# Patient Record
Sex: Female | Born: 1980 | Hispanic: No | Marital: Married | Smoking: Never smoker
Health system: Southern US, Community
[De-identification: ages and names within clinical notes are randomized; demographics above are authoritative.]

## PROBLEM LIST (undated history)

## (undated) DIAGNOSIS — R519 Headache, unspecified: Secondary | ICD-10-CM

## (undated) DIAGNOSIS — G4730 Sleep apnea, unspecified: Secondary | ICD-10-CM

## (undated) DIAGNOSIS — R51 Headache: Secondary | ICD-10-CM

## (undated) HISTORY — PX: WISDOM TOOTH EXTRACTION: SHX21

---

## 2006-01-15 ENCOUNTER — Other Ambulatory Visit: Admission: RE | Admit: 2006-01-15 | Discharge: 2006-01-15 | Payer: Self-pay | Admitting: Obstetrics and Gynecology

## 2007-05-31 ENCOUNTER — Ambulatory Visit: Payer: Self-pay | Admitting: Family Medicine

## 2010-10-03 ENCOUNTER — Ambulatory Visit: Payer: Self-pay | Admitting: Internal Medicine

## 2016-06-29 ENCOUNTER — Other Ambulatory Visit: Payer: Self-pay | Admitting: Family Medicine

## 2016-06-29 ENCOUNTER — Ambulatory Visit
Admission: RE | Admit: 2016-06-29 | Discharge: 2016-06-29 | Disposition: A | Discharge status: Home / Self Care | Payer: Managed Care, Other (non HMO) | Source: Ambulatory Visit | Attending: Family Medicine | Admitting: Family Medicine

## 2016-06-29 DIAGNOSIS — N83201 Unspecified ovarian cyst, right side: Secondary | ICD-10-CM | POA: Insufficient documentation

## 2016-06-29 DIAGNOSIS — R1032 Left lower quadrant pain: Secondary | ICD-10-CM

## 2017-04-13 ENCOUNTER — Other Ambulatory Visit: Payer: Self-pay | Admitting: Obstetrics and Gynecology

## 2017-04-13 DIAGNOSIS — Z369 Encounter for antenatal screening, unspecified: Secondary | ICD-10-CM

## 2017-05-03 ENCOUNTER — Ambulatory Visit (HOSPITAL_BASED_OUTPATIENT_CLINIC_OR_DEPARTMENT_OTHER)
Admission: RE | Admit: 2017-05-03 | Discharge: 2017-05-03 | Disposition: A | Discharge status: Home / Self Care | Payer: BLUE CROSS/BLUE SHIELD | Source: Ambulatory Visit | Attending: Maternal and Fetal Medicine | Admitting: Maternal and Fetal Medicine

## 2017-05-03 ENCOUNTER — Encounter: Payer: Self-pay | Admitting: *Deleted

## 2017-05-03 ENCOUNTER — Ambulatory Visit
Admission: RE | Admit: 2017-05-03 | Discharge: 2017-05-03 | Disposition: A | Discharge status: Home / Self Care | Payer: Managed Care, Other (non HMO) | Source: Ambulatory Visit | Attending: Maternal and Fetal Medicine | Admitting: Maternal and Fetal Medicine

## 2017-05-03 VITALS — BP 123/69 | HR 88 | Temp 98.3°F | Resp 18 | Ht 63.6 in | Wt 121.2 lb

## 2017-05-03 DIAGNOSIS — O09511 Supervision of elderly primigravida, first trimester: Secondary | ICD-10-CM | POA: Insufficient documentation

## 2017-05-03 DIAGNOSIS — Z3401 Encounter for supervision of normal first pregnancy, first trimester: Secondary | ICD-10-CM | POA: Insufficient documentation

## 2017-05-03 DIAGNOSIS — Z369 Encounter for antenatal screening, unspecified: Secondary | ICD-10-CM

## 2017-05-03 DIAGNOSIS — Z3A12 12 weeks gestation of pregnancy: Secondary | ICD-10-CM | POA: Insufficient documentation

## 2017-05-03 HISTORY — DX: Sleep apnea, unspecified: G47.30

## 2017-05-03 NOTE — Progress Notes (Addendum)
Referring Provider:   [facility] Ob/Gyn Length of Consultation: 45 minutes  Michele Hicks was referred to [facility] for genetic counseling because of advanced maternal age.  The patient will be 36 years old at the time of delivery.  This note summarizes the information we discussed.    We explained that the chance of a chromosome abnormality increases with maternal age.  Chromosomes and examples of chromosome problems were reviewed.  Humans typically have 46 chromosomes in each cell, with half passed through each sperm and egg.  Any change in the number or structure of chromosomes can increase the risk of problems in the physical and mental development of a pregnancy.   Based upon age of the patient, the chance of any chromosome abnormality was 1 in 59. The chance of Down syndrome, the most common chromosome problem associated with maternal age, was 1 in 31.  The risk of chromosome problems is in addition to the 3% general population risk for birth defects and mental retardation.  The greatest chance, of course, is that the baby would be born in good health.  We discussed the following prenatal screening and testing options for this pregnancy:  First trimester screening, which includes nuchal translucency ultrasound screen and first trimester maternal serum marker screening.  The nuchal translucency has approximately an 80% detection rate for Down syndrome and can be positive for other chromosome abnormalities as well as heart defects.  When combined with a maternal serum marker screening, the detection rate is up to 90% for Down syndrome and up to 97% for trisomy 18.     The chorionic villus sampling procedure is available for first trimester chromosome analysis.  This involves the withdrawal of a small amount of chorionic villi (tissue from the developing placenta).  Risk of pregnancy loss is estimated to be approximately 1 in 200 to 1 in 100 (0.5 to 1%).  There is  approximately a 1% (1 in 100) chance that the CVS chromosome results will be unclear.  Chorionic villi cannot be tested for neural tube defects.     Maternal serum marker screening, a blood test that measures pregnancy proteins, can provide risk assessments for Down syndrome, trisomy 18, and open neural tube defects (spina bifida, anencephaly). Because it does not directly examine the fetus, it cannot positively diagnose or rule out these problems.  Targeted ultrasound uses high frequency sound waves to create an image of the developing fetus.  An ultrasound is often recommended as a routine means of evaluating the pregnancy.  It is also used to screen for fetal anatomy problems (for example, a heart defect) that might be suggestive of a chromosomal or other abnormality.   Amniocentesis involves the removal of a small amount of amniotic fluid from the sac surrounding the fetus with the use of a thin needle inserted through the maternal abdomen and uterus.  Ultrasound guidance is used throughout the procedure.  Fetal cells from amniotic fluid are directly evaluated and > 99.5% of chromosome problems and > 98% of open neural tube defects can be detected. This procedure is generally performed after the 15th week of pregnancy.  The main risks to this procedure include complications leading to miscarriage in less than 1 in 200 cases (0.5%).  We also reviewed the availability of cell free fetal DNA testing from maternal blood to determine whether or not the baby may have either Down syndrome, trisomy 59, or trisomy 21.  This test utilizes a maternal blood sample and  DNA sequencing technology to isolate circulating cell free fetal DNA from maternal plasma.  The fetal DNA can then be analyzed for DNA sequences that are derived from the three most common chromosomes involved in aneuploidy, chromosomes 13, 18, and 21.  If the overall amount of DNA is greater than the expected level for any of these chromosomes,  aneuploidy is suspected.  While we do not consider it a replacement for invasive testing and karyotype analysis, a negative result from this testing would be reassuring, though not a guarantee of a normal chromosome complement for the baby.  An abnormal result is certainly suggestive of an abnormal chromosome complement, though we would still recommend CVS or amniocentesis to confirm any findings from this testing.  Cystic Fibrosis and Spinal Muscular Atrophy (SMA) screening were also discussed with the patient. Both conditions are recessive, which means that both parents must be carriers in order to have a child with the disease.  Cystic fibrosis (CF) is one of the most common genetic conditions in persons of Caucasian ancestry.  This condition occurs in approximately 1 in 2,500 Caucasian persons and results in thickened secretions in the lungs, digestive, and reproductive systems.  For a baby to be at risk for having CF, both of the parents must be carriers for this condition.  Approximately 1 in 38 Caucasian persons is a carrier for CF.  Current carrier testing looks for the most common mutations in the gene for CF and can detect approximately 90% of carriers in the Caucasian population.  This means that the carrier screening can greatly reduce, but cannot eliminate, the chance for an individual to have a child with CF.  If an individual is found to be a carrier for CF, then carrier testing would be available for the partner. As part of [place]'s newborn screening profile, all babies born in the state of [place] will have a two-tier screening process.  Specimens are first tested to determine the concentration of immunoreactive trypsinogen (IRT).  The top 5% of specimens with the highest IRT values then undergo DNA testing using a panel of over 40 common CF mutations. SMA is a neurodegenerative disorder that leads to atrophy of skeletal muscle and overall weakness.  This condition is also more  prevalent in the Caucasian population, with 1 in 40-1 in 60 persons being a carrier and 1 in 6,000-1 in 10,000 children being affected.  There are multiple forms of the disease, with some causing death in infancy to other forms with survival into adulthood.  The genetics of SMA is complex, but carrier screening can detect up to 95% of carriers in the Caucasian population.  Similar to CF, a negative result can greatly reduce, but cannot eliminate, the chance to have a child with SMA.  We obtained a detailed family history and pregnancy history.  The family history is unremarkable for birth defects, developmental delays, recurrent pregnancy loss or known chromosome abnormalities.  Ms. Mahl stated that this is her first pregnancy.  She reported no complications or exposures that would be expected to increase the risk for birth defects.  After consideration of the options, Ms. Mikowski elected to proceed with cell free DNA testing and to decline CF and SMA carrier screening. The patient indicated that she would likely not be interested in invasive prenatal diagnosis regardless of the results of her screening, but would like to know if there is an increased risk.  An ultrasound was performed at the time of the visit.  The  gestational age was consistent with  12 weeks.  Fetal anatomy could not be assessed due to early gestational age.  Please refer to the ultrasound report for details of that study.  Ms. Elesa MassedWard was encouraged to call with questions or concerns.  We can be contacted at [phone].    Michele Andersoneborah F. Wells, MS, CGC  I was immediately available and supervising. Michele PhenesBrenna L Tanayia Wahlquist, MD [facility]

## 2017-05-08 LAB — INFORMASEQ(SM) WITH XY ANALYSIS
FETAL NUMBER: 1
Fetal Fraction (%):: 11.8
Gestational Age at Collection: 12 weeks
Weight: 121 [lb_av]

## 2017-05-10 ENCOUNTER — Telehealth: Payer: Self-pay | Admitting: Obstetrics and Gynecology

## 2017-05-10 NOTE — Telephone Encounter (Signed)
The patient was informed of the results of her recent InformaSeq testing (performed at [facility]) which yielded NEGATIVE results.  The patient's specimen showed DNA consistent with two copies of chromosomes 21, 18 and 13.  The sensitivity for trisomy 21, trisomy 18 and trisomy 13 using this testing are reported as 99.1%, 98.3% and 98.1% respectively.  Thus, while the results of this testing are highly accurate, they are not considered diagnostic at this time.  Should more definitive information be desired, the patient may still consider amniocentesis.   As requested to know by the patient, sex chromosome analysis was included for this sample.  Results was consistent with a female (XX) fetus. This is predicted with >97% accuracy.  A maternal serum AFP only should be considered if screening for neural tube defects is desired.  Orren Pietsch F. Armend Hochstatter, MS, CGC   

## 2017-10-08 ENCOUNTER — Encounter (HOSPITAL_COMMUNITY): Payer: Self-pay

## 2017-10-30 NOTE — H&P (Signed)
OB ADMISSION/ HISTORY & PHYSICAL:  Admission Date: No admission date for patient encounter.  Admit Diagnosis: Planned cesarean section  Michele Hicks is a 36 y.o. female presenting for planned primary C/S for breech presentation.  Prenatal History: G1P0   EDC : Not found.  Prenatal care at [facility] Prenatal course complicated by   Breech presentation at 36wks  Abnormal 1 hr GTT-210, 3 hr GTT-08/30/17-WNL (21,308,657,84(81,144,128,85)  AMA: cffDNA negative.   OSA - has mouth guard (s/p ENT eval)  Recommend weekly  NST  for fetal hypoxia risk.  Consider q4wk ultrasound starting @ 28wks.ordered 10/03/17    36 y.o. G1P0000 at 3355w0d by  LMP c/w  week ultrasound Sex of baby and name:  "Darlin Dropeagan Carter " baby girl  Factors complicating this pregnancy    AMA, OSA   Screening results and needs:  NOB:   MBT  AB+  Ab screen  Negative G/C Neg/Neg HIV Negative Hep B/RPR Neg/Non-reactive  Rubella Immune   VZV Immune  Aneuploidy:   First trimester: (Did they do cffDNA or NT/blood draw?) 04/12/17 Informaseq testing = Negative results  Informaseq: 1:10,000 - low risk    Second trimester (AFP/tetra): declines  28 weeks:   Blood consent: 08/23/17  Hgb: 11.1 Platelet Count: 250  Glucola:210, 3 hr GTT-08/30/17- WNL (69,629,528,41(81,144,128,85) Rhogam: N/A  36 weeks:   GBS: negative   G/C: neg/neg  Hgb: 12.1    RPR: Non reactive  Last US:  04/12/17: Vt. Retroverted, Single viable IUP, S=6632w4d, FHR=172bpm, YS and amnion imaged, Cx long and closed                    06/22/17: Normal anatomy scan      Immunization:    Flu in season - declined 09/05/17  Tdap at 27-36 weeks - pt cannot get - had seizures with it in past (given Td in 2017)   Breast, condoms  Medical / Surgical History :  Past medical history:  Past Medical History:  Diagnosis Date  . Sleep apnea      Past surgical history:  Past Surgical History:  Procedure Laterality Date  . WISDOM TOOTH EXTRACTION      Family History: No family  history on file.   Social History:  reports that  has never smoked. she has never used smokeless tobacco. She reports that she does not drink alcohol or use drugs.   Allergies: Codeine and Penicillins    Current Medications at time of admission:  Prior to Admission medications   Medication Sig Start Date End Date Taking? Authorizing Provider  fluticasone (FLONASE) 50 MCG/ACT nasal spray Place into both nostrils daily.    [provider]  Prenatal Vit-Fe Fumarate-FA (MULTIVITAMIN-PRENATAL) 27-0.8 MG TABS tablet Take 1 tablet by mouth daily at 12 noon.    [provider]     Review of Systems: Active FM   Physical Exam:  VS: unknown if currently breastfeeding.    Assessment: [redacted] weeks gestation 0 stage of labor FHR category 1   Plan:   H&P started for planned cesearen section. However, patient went into labor prior to surgery, and was delivered by another provider.  The risks of cesarean section discussed with the patient included but were not limited to: bleeding which may require transfusion or reoperation; infection which may require antibiotics; injury to bowel, bladder, ureters or other surrounding organs; injury to the fetus; need for additional procedures including hysterectomy in the event of a life-threatening hemorrhage; placental abnormalities wth subsequent  pregnancies, incisional problems, thromboembolic phenomenon and other postoperative/anesthesia complications. The patient concurred with the proposed plan, giving informed written consent for the procedure.   Patient has been NPO since yesterday and she will remain NPO for procedure. Anesthesia and OR aware. Preoperative prophylactic antibiotics and SCDs ordered on call to the OR.  To OR when ready.

## 2017-11-09 ENCOUNTER — Encounter
Admission: RE | Admit: 2017-11-09 | Discharge: 2017-11-09 | Disposition: A | Discharge status: Home / Self Care | Payer: BLUE CROSS/BLUE SHIELD | Source: Ambulatory Visit | Attending: Obstetrics and Gynecology | Admitting: Obstetrics and Gynecology

## 2017-11-09 ENCOUNTER — Other Ambulatory Visit: Payer: Self-pay

## 2017-11-09 HISTORY — DX: Headache, unspecified: R51.9

## 2017-11-09 HISTORY — DX: Headache: R51

## 2017-11-09 LAB — BASIC METABOLIC PANEL
Anion gap: 9 (ref 5–15)
BUN: 9 mg/dL (ref 6–20)
CHLORIDE: 102 mmol/L (ref 101–111)
CO2: 23 mmol/L (ref 22–32)
CREATININE: 0.37 mg/dL — AB (ref 0.44–1.00)
Calcium: 8.4 mg/dL — ABNORMAL LOW (ref 8.9–10.3)
GFR calc non Af Amer: >60 mL/min (ref >60)
GLUCOSE: 69 mg/dL (ref 65–99)
Potassium: 4 mmol/L (ref 3.5–5.1)
Sodium: 134 mmol/L — ABNORMAL LOW (ref 135–145)

## 2017-11-09 LAB — CBC
HCT: 35.3 % (ref 35.0–47.0)
Hemoglobin: 12.5 g/dL (ref 12.0–16.0)
MCH: 33 pg (ref 26.0–34.0)
MCHC: 35.3 g/dL (ref 32.0–36.0)
MCV: 93.4 fL (ref 80.0–100.0)
PLATELETS: 261 10*3/uL (ref 150–440)
RBC: 3.77 MIL/uL — AB (ref 3.80–5.20)
RDW: 12.8 % (ref 11.5–14.5)
WBC: 8.4 10*3/uL (ref 3.6–11.0)

## 2017-11-09 LAB — TYPE AND SCREEN
ABO/RH(D): AB POS
Antibody Screen: NEGATIVE
EXTEND SAMPLE REASON: UNDETERMINED

## 2017-11-09 NOTE — Patient Instructions (Signed)
Your procedure is scheduled on: Mon 12/10 Report to ER at 5:30 AM  Remember: Instructions that are not followed completely may result in serious medical risk, up to and including death, or upon the discretion of your surgeon and anesthesiologist your surgery may need to be rescheduled.     _X__ 1. Do not eat food after midnight the night before your procedure.                 No gum chewing or hard candies. You may drink clear liquids up to 2 hours                 before you are scheduled to arrive for your surgery- DO not drink clear                 liquids within 2 hours of the start of your surgery.                 Clear Liquids include:  water, apple juice without pulp, clear carbohydrate                 drink such as Clearfast of Gartorade, Black Coffee or Tea (Do not add                 anything to coffee or tea).     ___ 2.  No Alcohol for 24 hours before or after surgery.   ___ 3.  Do Not Smoke or use e-cigarettes For 24 Hours Prior to Your Surgery.                 Do not use any chewable tobacco products for at least 6 hours prior to                 surgery.  ____  4.  Bring all medications with you on the day of surgery if instructed.   __x__  5.  Notify your doctor if there is any change in your medical condition      (cold, fever, infections).     Do not wear jewelry, make-up, hairpins, clips or nail polish. Do not wear lotions, powders, or perfumes. You may wear deodorant. Do not shave 48 hours prior to surgery. Men may shave face and neck. Do not bring valuables to the hospital.    [facility] any belongings or valuables.  Contacts, dentures or bridgework may not be worn into surgery. Leave your suitcase in the car. After surgery it may be brought to your room. For patients admitted to the hospital, discharge time is determined by your treatment team.   Patients discharged the day of surgery will not be allowed to drive  home.   Please read over the following fact sheets that you were given:    __x__ Take these medicines the morning of surgery with A SIP OF WATER:    1. fluticasone (FLONASE) 50 MCG/ACT nasal spray  2.   3.   4.  5.  6.  ____ Fleet Enema (as directed)   _x___ Use CHG Soap as directed  ____ Use inhalers on the day of surgery  ____ Stop metformin 2 days prior to surgery    ____ Take 1/2 of usual insulin dose the night before surgery. No insulin the morning          of surgery.   ____ Stop Coumadin/Plavix/aspirin on   ____ Stop Anti-inflammatories on    ____ Stop supplements until after surgery.  ____ Bring C-Pap to the hospital.

## 2017-11-10 ENCOUNTER — Inpatient Hospital Stay
Admission: EM | Admit: 2017-11-10 | Discharge: 2017-11-13 | DRG: 788 | Disposition: A | Discharge status: Home / Self Care | Payer: BLUE CROSS/BLUE SHIELD | Attending: Obstetrics & Gynecology | Admitting: Obstetrics & Gynecology

## 2017-11-10 ENCOUNTER — Encounter
Admission: EM | Disposition: A | Discharge status: Home / Self Care | Payer: Self-pay | Source: Home / Self Care | Attending: Obstetrics & Gynecology

## 2017-11-10 ENCOUNTER — Encounter: Payer: Self-pay | Admitting: *Deleted

## 2017-11-10 ENCOUNTER — Other Ambulatory Visit: Payer: Self-pay

## 2017-11-10 ENCOUNTER — Inpatient Hospital Stay: Payer: BLUE CROSS/BLUE SHIELD | Admitting: Certified Registered Nurse Anesthetist

## 2017-11-10 DIAGNOSIS — Z880 Allergy status to penicillin: Secondary | ICD-10-CM | POA: Diagnosis not present

## 2017-11-10 DIAGNOSIS — O4292 Full-term premature rupture of membranes, unspecified as to length of time between rupture and onset of labor: Secondary | ICD-10-CM | POA: Diagnosis present

## 2017-11-10 DIAGNOSIS — K219 Gastro-esophageal reflux disease without esophagitis: Secondary | ICD-10-CM | POA: Diagnosis present

## 2017-11-10 DIAGNOSIS — O9962 Diseases of the digestive system complicating childbirth: Secondary | ICD-10-CM | POA: Diagnosis present

## 2017-11-10 DIAGNOSIS — Z3A39 39 weeks gestation of pregnancy: Secondary | ICD-10-CM

## 2017-11-10 DIAGNOSIS — O321XX0 Maternal care for breech presentation, not applicable or unspecified: Secondary | ICD-10-CM | POA: Diagnosis present

## 2017-11-10 LAB — RPR: RPR: NONREACTIVE

## 2017-11-10 SURGERY — Surgical Case
Anesthesia: *Unknown

## 2017-11-10 SURGERY — Surgical Case
Anesthesia: Spinal

## 2017-11-10 MED ORDER — ONDANSETRON HCL 4 MG/2ML IJ SOLN
4.0000 mg | Freq: Three times a day (TID) | INTRAMUSCULAR | Status: DC | PRN
  Administered 2017-11-11: 4 mg via INTRAVENOUS
  Filled 2017-11-10: qty 2

## 2017-11-10 MED ORDER — MENTHOL 3 MG MT LOZG
1.0000 | LOZENGE | OROMUCOSAL | Status: DC | PRN
  Filled 2017-11-10: qty 9

## 2017-11-10 MED ORDER — COCONUT OIL OIL
1.0000 "application " | TOPICAL_OIL | Status: DC | PRN
  Administered 2017-11-11 – 2017-11-12 (×2): 1 via TOPICAL
  Filled 2017-11-10: qty 120

## 2017-11-10 MED ORDER — ACETAMINOPHEN 500 MG PO TABS
1000.0000 mg | ORAL_TABLET | Freq: Four times a day (QID) | ORAL | Status: DC
  Administered 2017-11-10 – 2017-11-13 (×11): 1000 mg via ORAL
  Filled 2017-11-10 (×12): qty 2

## 2017-11-10 MED ORDER — SODIUM CHLORIDE 0.9% FLUSH: 3.0000 mL | INTRAVENOUS | Status: DC | PRN

## 2017-11-10 MED ORDER — OXYTOCIN 40 UNITS IN LACTATED RINGERS INFUSION - SIMPLE MED
2.5000 [IU]/h | INTRAVENOUS | Status: AC
  Administered 2017-11-10: 2.5 [IU]/h via INTRAVENOUS

## 2017-11-10 MED ORDER — OXYCODONE HCL 5 MG PO TABS: 10.0000 mg | ORAL_TABLET | ORAL | Status: DC | PRN

## 2017-11-10 MED ORDER — ACETAMINOPHEN 325 MG PO TABS: 650.0000 mg | ORAL_TABLET | ORAL | Status: DC | PRN

## 2017-11-10 MED ORDER — DIBUCAINE 1 % RE OINT: 1.0000 "application " | TOPICAL_OINTMENT | RECTAL | Status: DC | PRN

## 2017-11-10 MED ORDER — PHENYLEPHRINE 40 MCG/ML (10ML) SYRINGE FOR IV PUSH (FOR BLOOD PRESSURE SUPPORT): PREFILLED_SYRINGE | INTRAVENOUS | Status: DC | PRN

## 2017-11-10 MED ORDER — SODIUM CHLORIDE 0.9% FLUSH: 3.0000 mL | Freq: Two times a day (BID) | INTRAVENOUS | Status: DC

## 2017-11-10 MED ORDER — BUPIVACAINE LIPOSOME 1.3 % IJ SUSP
20.0000 mL | Freq: Once | INTRAMUSCULAR | Status: AC
  Filled 2017-11-10: qty 20

## 2017-11-10 MED ORDER — IBUPROFEN 600 MG PO TABS: 600.0000 mg | ORAL_TABLET | Freq: Four times a day (QID) | ORAL | Status: DC

## 2017-11-10 MED ORDER — LACTATED RINGERS IV SOLN: INTRAVENOUS | Status: DC

## 2017-11-10 MED ORDER — SOD CITRATE-CITRIC ACID 500-334 MG/5ML PO SOLN
30.0000 mL | ORAL | Status: DC
  Filled 2017-11-10: qty 15

## 2017-11-10 MED ORDER — KETOROLAC TROMETHAMINE 30 MG/ML IJ SOLN
30.0000 mg | Freq: Once | INTRAMUSCULAR | Status: AC
  Administered 2017-11-10: 30 mg via INTRAVENOUS
  Filled 2017-11-10: qty 1

## 2017-11-10 MED ORDER — NALBUPHINE HCL 10 MG/ML IJ SOLN: 5.0000 mg | Freq: Once | INTRAMUSCULAR | Status: DC | PRN

## 2017-11-10 MED ORDER — ONDANSETRON HCL 4 MG/2ML IJ SOLN: 4.0000 mg | Freq: Once | INTRAMUSCULAR | Status: DC | PRN

## 2017-11-10 MED ORDER — ONDANSETRON HCL 4 MG/2ML IJ SOLN: 4.0000 mg | Freq: Four times a day (QID) | INTRAMUSCULAR | Status: DC | PRN

## 2017-11-10 MED ORDER — OXYTOCIN 40 UNITS IN LACTATED RINGERS INFUSION - SIMPLE MED
2.5000 [IU]/h | INTRAVENOUS | Status: DC
  Administered 2017-11-10: 100 mL via INTRAVENOUS
  Administered 2017-11-10: 200 mL via INTRAVENOUS
  Administered 2017-11-10: 400 mL via INTRAVENOUS
  Filled 2017-11-10: qty 1000

## 2017-11-10 MED ORDER — DEXTROSE 5 % IV SOLN
1.0000 ug/kg/h | INTRAVENOUS | Status: DC | PRN
  Filled 2017-11-10: qty 5

## 2017-11-10 MED ORDER — DEXTROSE 5 % IV SOLN
500.0000 mg | Freq: Once | INTRAVENOUS | Status: AC
  Administered 2017-11-10: 500 mg via INTRAVENOUS
  Filled 2017-11-10: qty 500

## 2017-11-10 MED ORDER — SIMETHICONE 80 MG PO CHEW
160.0000 mg | CHEWABLE_TABLET | Freq: Four times a day (QID) | ORAL | Status: DC | PRN
  Administered 2017-11-11: 160 mg via ORAL
  Filled 2017-11-10 (×2): qty 2

## 2017-11-10 MED ORDER — GENTAMICIN SULFATE 40 MG/ML IJ SOLN: INTRAVENOUS | Status: DC

## 2017-11-10 MED ORDER — FENTANYL CITRATE (PF) 100 MCG/2ML IJ SOLN
INTRAMUSCULAR | Status: DC | PRN
  Administered 2017-11-10: 10 ug via INTRATHECAL
  Administered 2017-11-10: 40 ug via INTRAVENOUS
  Administered 2017-11-10: 50 ug via INTRAVENOUS

## 2017-11-10 MED ORDER — LACTATED RINGERS IV SOLN
500.0000 mL | INTRAVENOUS | Status: DC | PRN
  Administered 2017-11-10: 1000 mL via INTRAVENOUS

## 2017-11-10 MED ORDER — LACTATED RINGERS IV SOLN
INTRAVENOUS | Status: DC
  Administered 2017-11-10: 11:00:00 via INTRAVENOUS

## 2017-11-10 MED ORDER — SOD CITRATE-CITRIC ACID 500-334 MG/5ML PO SOLN
30.0000 mL | ORAL | Status: DC | PRN
Start: 2017-11-10 — End: 2017-11-10

## 2017-11-10 MED ORDER — BUPIVACAINE HCL (PF) 0.25 % IJ SOLN
INTRAMUSCULAR | Status: DC | PRN
  Administered 2017-11-10: 30 mL

## 2017-11-10 MED ORDER — MEPERIDINE HCL 25 MG/ML IJ SOLN: 6.2500 mg | INTRAMUSCULAR | Status: DC | PRN

## 2017-11-10 MED ORDER — SENNOSIDES-DOCUSATE SODIUM 8.6-50 MG PO TABS
2.0000 | ORAL_TABLET | ORAL | Status: DC
  Administered 2017-11-11 – 2017-11-13 (×3): 2 via ORAL
  Filled 2017-11-10 (×3): qty 2

## 2017-11-10 MED ORDER — SODIUM CHLORIDE 0.9 % IJ SOLN
INTRAMUSCULAR | Status: AC
  Administered 2017-11-10: 12:00:00
  Filled 2017-11-10: qty 50

## 2017-11-10 MED ORDER — NALBUPHINE HCL 10 MG/ML IJ SOLN: 5.0000 mg | INTRAMUSCULAR | Status: DC | PRN

## 2017-11-10 MED ORDER — DIPHENHYDRAMINE HCL 25 MG PO CAPS: 25.0000 mg | ORAL_CAPSULE | Freq: Four times a day (QID) | ORAL | Status: DC | PRN

## 2017-11-10 MED ORDER — LACTATED RINGERS IV SOLN
INTRAVENOUS | Status: DC
  Administered 2017-11-10 – 2017-11-12 (×3): via INTRAVENOUS

## 2017-11-10 MED ORDER — FENTANYL CITRATE (PF) 100 MCG/2ML IJ SOLN: 25.0000 ug | INTRAMUSCULAR | Status: DC | PRN

## 2017-11-10 MED ORDER — BUPIVACAINE LIPOSOME 1.3 % IJ SUSP: 20.0000 mL | INTRAMUSCULAR | Status: DC

## 2017-11-10 MED ORDER — SODIUM CHLORIDE 0.9 % IV SOLN: 250.0000 mL | INTRAVENOUS | Status: DC

## 2017-11-10 MED ORDER — MORPHINE SULFATE (PF) 0.5 MG/ML IJ SOLN
INTRAMUSCULAR | Status: DC | PRN
  Administered 2017-11-10: .1 mg via EPIDURAL

## 2017-11-10 MED ORDER — GENTAMICIN SULFATE 40 MG/ML IJ SOLN
INTRAVENOUS | Status: AC
  Administered 2017-11-10: 330 mL via INTRAVENOUS
  Filled 2017-11-10: qty 8.25

## 2017-11-10 MED ORDER — NALOXONE HCL 0.4 MG/ML IJ SOLN: 0.4000 mg | INTRAMUSCULAR | Status: DC | PRN

## 2017-11-10 MED ORDER — WITCH HAZEL-GLYCERIN EX PADS: 1.0000 "application " | MEDICATED_PAD | CUTANEOUS | Status: DC | PRN

## 2017-11-10 MED ORDER — OXYTOCIN BOLUS FROM INFUSION: 500.0000 mL | Freq: Once | INTRAVENOUS | Status: DC

## 2017-11-10 MED ORDER — BUPIVACAINE LIPOSOME 1.3 % IJ SUSP: 20.0000 mL | Freq: Once | INTRAMUSCULAR | Status: AC

## 2017-11-10 MED ORDER — SODIUM CHLORIDE 0.9 % IV SOLN
INTRAVENOUS | Status: DC | PRN
  Administered 2017-11-10: 70 mL

## 2017-11-10 MED ORDER — ONDANSETRON HCL 4 MG/2ML IJ SOLN
INTRAMUSCULAR | Status: DC | PRN
  Administered 2017-11-10: 4 mg via INTRAVENOUS

## 2017-11-10 MED ORDER — KETOROLAC TROMETHAMINE 30 MG/ML IJ SOLN
30.0000 mg | Freq: Four times a day (QID) | INTRAMUSCULAR | Status: DC | PRN
  Administered 2017-11-10 – 2017-11-12 (×4): 30 mg via INTRAVENOUS
  Filled 2017-11-10 (×4): qty 1

## 2017-11-10 MED ORDER — OXYCODONE HCL 5 MG PO TABS
5.0000 mg | ORAL_TABLET | ORAL | Status: DC | PRN
  Filled 2017-11-10: qty 1

## 2017-11-10 MED ORDER — PRENATAL MULTIVITAMIN CH
1.0000 | ORAL_TABLET | Freq: Every day | ORAL | Status: DC
  Administered 2017-11-10 – 2017-11-13 (×4): 1 via ORAL
  Filled 2017-11-10 (×4): qty 1

## 2017-11-10 MED ORDER — PHENYLEPHRINE HCL 10 MG/ML IJ SOLN
INTRAMUSCULAR | Status: DC | PRN
  Administered 2017-11-10: 100 ug via INTRAVENOUS
  Administered 2017-11-10 (×3): 200 ug via INTRAVENOUS

## 2017-11-10 MED ORDER — BUPIVACAINE HCL (PF) 0.5 % IJ SOLN
30.0000 mL | Freq: Once | INTRAMUSCULAR | Status: AC
  Administered 2017-11-10: 30 mL
  Filled 2017-11-10: qty 30

## 2017-11-10 MED ORDER — BUPIVACAINE IN DEXTROSE 0.75-8.25 % IT SOLN
INTRATHECAL | Status: DC | PRN
  Administered 2017-11-10: 1.5 mL via INTRATHECAL

## 2017-11-10 MED ORDER — SOD CITRATE-CITRIC ACID 500-334 MG/5ML PO SOLN
30.0000 mL | ORAL | Status: AC
  Administered 2017-11-10: 30 mL via ORAL

## 2017-11-10 SURGICAL SUPPLY — 23 items
BARRIER ADHS 3X4 INTERCEED (GAUZE/BANDAGES/DRESSINGS) ×3 IMPLANT
CANISTER SUCT 3000ML PPV (MISCELLANEOUS) ×3 IMPLANT
CHLORAPREP W/TINT 26ML (MISCELLANEOUS) ×3 IMPLANT
DRSG TELFA 3X8 NADH (GAUZE/BANDAGES/DRESSINGS) IMPLANT
ELECT REM PT RETURN 9FT ADLT (ELECTROSURGICAL) ×3
ELECTRODE REM PT RTRN 9FT ADLT (ELECTROSURGICAL) ×1 IMPLANT
GAUZE SPONGE 4X4 12PLY STRL (GAUZE/BANDAGES/DRESSINGS) IMPLANT
GOWN STRL REUS W/ TWL LRG LVL3 (GOWN DISPOSABLE) ×3 IMPLANT
GOWN STRL REUS W/TWL LRG LVL3 (GOWN DISPOSABLE) ×6
NS IRRIG 1000ML POUR BTL (IV SOLUTION) ×3 IMPLANT
PAD OB MATERNITY 4.3X12.25 (PERSONAL CARE ITEMS) ×3 IMPLANT
PAD PREP 24X41 OB/GYN DISP (PERSONAL CARE ITEMS) ×3 IMPLANT
SPONGE LAP 18X18 5 PK (GAUZE/BANDAGES/DRESSINGS) ×3 IMPLANT
SUT MNCRL 4-0 (SUTURE) ×2
SUT MNCRL 4-0 27XMFL (SUTURE) ×1
SUT PDS AB 1 TP1 96 (SUTURE) ×3 IMPLANT
SUT PLAIN 2 0 XLH (SUTURE) ×3 IMPLANT
SUT PLAIN GUT 2-0 30 C14 SG823 (SUTURE) ×3
SUT VIC AB 0 CT1 36 (SUTURE) ×9 IMPLANT
SUT VIC AB 3-0 SH 27 (SUTURE) ×2
SUT VIC AB 3-0 SH 27X BRD (SUTURE) ×1 IMPLANT
SUTURE MNCRL 4-0 27XMF (SUTURE) ×1 IMPLANT
SUTURE PLN GUT2-0 30 C14 SG823 (SUTURE) ×1 IMPLANT

## 2017-11-10 NOTE — OB Triage Note (Signed)
Reports SROM @ 0730 this am. Gush of fluid per pt. Clear fluid. Reports + fetal movement. Has had to wear a pad for fluid leakage. Baby is breech per US on Thursday. Elaina HoopsElks, Herron Fero S

## 2017-11-10 NOTE — Transfer of Care (Signed)
Immediate Anesthesia Transfer of Care Note  Patient: Michele Hicks  Procedure(s) Performed: CESAREAN SECTION (N/A )  Patient Location: PACU  Anesthesia Type:Spinal  Level of Consciousness: awake, alert  and oriented  Airway & Oxygen Therapy: Patient Spontanous Breathing  Post-op Assessment: Report given to RN and Post -op Vital signs reviewed and stable  Post vital signs: Reviewed and stable  Last Vitals:  Vitals:   11/10/17 0855 11/10/17 1253  BP: 102/64 (!) 83/67  Pulse: 89 78  Resp: 18 16  Temp: 36.8 C 36.5 C  SpO2:  100%    Last Pain:  Vitals:   11/10/17 1253  TempSrc: Oral  PainSc:          Complications: No apparent anesthesia complications

## 2017-11-10 NOTE — Anesthesia Procedure Notes (Signed)
Spinal  Patient location during procedure: OR Start time: 11/10/2017 11:29 AM End time: 11/10/2017 11:30 AM Staffing Anesthesiologist: Martha Clan, MD Resident/CRNA: Johnna Acosta, CRNA Performed: resident/CRNA  Preanesthetic Checklist Completed: patient identified, site marked, surgical consent, pre-op evaluation, timeout performed, IV checked, risks and benefits discussed and monitors and equipment checked Spinal Block Patient position: sitting Prep: ChloraPrep Patient monitoring: heart rate, continuous pulse ox, blood pressure and cardiac monitor Approach: midline Location: L3-4 Injection technique: single-shot Needle Needle type: Whitacre and Introducer  Needle gauge: 24 G Needle length: 9 cm Assessment Sensory level: T10 Additional Notes Negative paresthesia. Negative blood return. Positive free-flowing CSF. Expiration date of kit checked and confirmed. Patient tolerated procedure well, without complications.

## 2017-11-10 NOTE — Plan of Care (Signed)
Alert and oriented with aprop. Affect. Color good, skin w&d. BBS cl. Able to achieve 1500 and greater in I.S. And moves well with Peri-Care. Foley draining cl. Amber urine. Lower abd. Incision is well approximated and without s/s complications. States effective pain control with scheduled Tylenol and PRN Toradol. Bonding well with Infant.

## 2017-11-10 NOTE — Discharge Summary (Signed)
Obstetrical Discharge Summary  Patient Name: Michele Hicks DOB: 05/17/1981 MRN: [id number]  Date of Admission: 11/10/2017 Date of Delivery: 11/10/17 Delivered by: Michele Hicks Date of Discharge: 11/13/2017  Primary OB: Michele Hicks Michele Hicks:Patient's last menstrual period was 02/08/2017. EDC Estimated Date of Delivery: 11/15/17 Gestational Age at Delivery: 7181w2d   Antepartum complications:  1. AMA 2. Breech  Admitting Diagnosis:  1. Premature rupture of membranes 2. Breech malposition  Secondary Diagnosis: Patient Active Problem List   Diagnosis Date Noted  . Labor and delivery indication for care or intervention 11/10/2017  . Advanced maternal age, primigravida in first trimester, antepartum     Augmentation: n/a Complications: None Intrapartum complications/course: presented with rupture of membranes, confirmed breech via ultrasound, uncomplicated cesarean delivery. Delivery Type: primary cesarean section, low transverse incision Anesthesia: spinal Placenta: expressed Laceration: n/a Episiotomy: n/a Newborn Data: Birth Weight: 6 lb 10.5 oz (3020 g) APGAR: 9, 10  Postpartum Procedures: None  Patient had an uncomplicated postpartum course. By time of discharge on POD#3, her pain was controlled on oral pain medications; she had appropriate lochia and was ambulating, voiding without difficulty, tolerating regular diet and passing flatus.  She was deemed stable for discharge to home.    Discharge Physical Exam:  BP (!) 94/53 (BP Location: Right Arm) Comment: nurse Michele Hicks notified  Pulse 73   Temp 97.9 F (36.6 C) (Oral)   Resp 18   Ht 5\' 3"  (1.6 m)   Wt 66.7 kg (147 lb)   LMP 02/08/2017   SpO2 98%   Breastfeeding? Unknown   BMI 26.04 kg/m   General: NAD CV: RRR Pulm: CTABL, nl effort ABD: s/nd/nt, fundus firm and below the umbilicus Lochia: moderate Incision: c/d/i.  Covered in surgical glue DVT Evaluation: LE non-ttp, no evidence of DVT on  exam.  Hemoglobin  Date Value Ref Range Status  11/11/2017 9.5 (L) 12.0 - 16.0 g/dL Final   HCT  Date Value Ref Range Status  11/11/2017 26.8 (L) 35.0 - 47.0 % Final    Disposition: stable, discharge to home. Baby Feeding: breastmilk  Baby Disposition: home with mom  Rh Immune globulin given: n/a  Rubella vaccine given: n/a Tdap vaccine given in AP or PP setting: no - allergic Flu vaccine given in AP or PP setting: no - declined  Contraception: condoms  Prenatal Labs:  Blood type/Rh AB+  Antibody screen neg  Rubella Immune  Varicella Immune  RPR NR  HBsAg Neg  HIV NR  GC neg  Chlamydia neg  Genetic screening Negative, informaseq XX diploid  1 hour GTT 210  3 hour GTT 81 / 144 / 128 / 85  GBS negative    Plan:  Essense C Lopez was discharged to home in good condition. Follow-up appointment with Dr. Elesa MassedWard in 2 weeks.  Discharge Medications: Allergies as of 11/13/2017      Reactions   Codeine Other (See Comments)   Chest pain and chest tightness   Tdap [diphth-acell Pertussis-tetanus] Other (See Comments)   Seizures   Penicillins Rash, Other (See Comments)   Has patient had a PCN reaction causing immediate rash, facial/tongue/throat swelling, SOB or lightheadedness with hypotension: Yes Has patient had a PCN reaction causing severe rash involving mucus membranes or skin necrosis: No Has patient had a PCN reaction that required hospitalization: No Has patient had a PCN reaction occurring within the last 10 years: Yes  If all of the above answers are "NO", then may proceed with Cephalosporin use.  Medication List    TAKE these medications   cyclobenzaprine 10 MG tablet Commonly known as:  FLEXERIL Take 1 tablet (10 mg total) by mouth 3 (three) times daily as needed for muscle spasms.   fluticasone 50 MCG/ACT nasal spray Commonly known as:  FLONASE Place 1-2 sprays into both nostrils daily.   HYDROmorphone 2 MG tablet Commonly known as:  DILAUDID Take  1 tablet (2 mg total) by mouth every 4 (four) hours as needed for severe pain.   multivitamin-prenatal 27-0.8 MG Tabs tablet Take 1 tablet by mouth daily.   PROBIOTIC PO Take 1 capsule by mouth daily.   ranitidine 150 MG tablet Commonly known as:  ZANTAC Take 150 mg by mouth 2 (two) times daily as needed for heartburn.   VISINE OP Place 2 drops into both eyes as needed (for dry eyes).      Pain management and prescriptions discussed with Dr. Dalbert GarnetBeasley. Rx for approximately 1 week of medication given, with instructions to patient and husband to wean off stronger pain medications over that time.    SignedGenia Del: Anjalina Bergevin CNM 11/13/2017 11:56 AM

## 2017-11-10 NOTE — Anesthesia Procedure Notes (Signed)
Date/Time: 11/10/2017 11:31 AM Performed by: Ginger CarneMichelet, Rasean Joos, CRNA Pre-anesthesia Checklist: Patient identified, Emergency Drugs available, Suction available, Patient being monitored and Timeout performed Patient Re-evaluated:Patient Re-evaluated prior to induction Oxygen Delivery Method: Nasal cannula

## 2017-11-10 NOTE — Anesthesia Post-op Follow-up Note (Signed)
Anesthesia QCDR form completed.        

## 2017-11-10 NOTE — Anesthesia Preprocedure Evaluation (Signed)
Anesthesia Evaluation  Patient identified by MRN, date of birth, ID band Patient awake    Reviewed: Allergy & Precautions, H&P , NPO status , Patient's Chart, lab work & pertinent test results, reviewed documented beta blocker date and time   History of Anesthesia Complications Negative for: history of anesthetic complications  Airway Mallampati: III  TM Distance: >3 FB Neck ROM: full    Dental  (+) Dental Advidsory Given, Teeth Intact   Pulmonary neg shortness of breath, sleep apnea , neg COPD, neg recent URI,           Cardiovascular Exercise Tolerance: Good negative cardio ROS       Neuro/Psych negative neurological ROS  negative psych ROS   GI/Hepatic Neg liver ROS, GERD  ,  Endo/Other  negative endocrine ROS  Renal/GU negative Renal ROS  negative genitourinary   Musculoskeletal   Abdominal   Peds  Hematology negative hematology ROS (+)   Anesthesia Other Findings Past Medical History: No date: Headache     Comment:  occassional migraines No date: Sleep apnea     Comment:  medical mouth gaurd   Reproductive/Obstetrics negative OB ROS                             Anesthesia Physical Anesthesia Plan  ASA: II  Anesthesia Plan: Spinal   Post-op Pain Management:    Induction:   PONV Risk Score and Plan:   Airway Management Planned: Nasal Cannula  Additional Equipment:   Intra-op Plan:   Post-operative Plan:   Informed Consent: I have reviewed the patients History and Physical, chart, labs and discussed the procedure including the risks, benefits and alternatives for the proposed anesthesia with the patient or authorized representative who has indicated his/her understanding and acceptance.   Dental Advisory Given  Plan Discussed with: Anesthesiologist, CRNA and Surgeon  Anesthesia Plan Comments:         Anesthesia Quick Evaluation

## 2017-11-10 NOTE — Op Note (Signed)
Cesarean Section Procedure Note  11/10/2017   Patient:  Michele Hicks  36 y.o. female at 3918w2d.  Patient's last menstrual period was 02/08/2017. Preoperative diagnosis:  Breech Presentation,in labor Postoperative diagnosis:  same  PROCEDURE:  Procedure(s): CESAREAN SECTION (N/A) Surgeon:  Surgeon(s) and Role:    * Mcclusky, Elenora Fenderhelsea C, MD - Primary Anesthesia:  spinal I/O: Total I/O In: 800 [I.V.:800] Out: 775 [Urine:175; Blood:600] Specimens: none Complications: None Apparent Disposition:  VS stable to PACU  Findings: normal uterus, tubes and ovaries bilaterally Live born female  Birth Weight: 6 lb 10.5 oz (3020 g) APGAR: 9, 10  Newborn Delivery   Birth date/time:  11/10/2017 11:54:00 Delivery type:  C-Section, Low Transverse C-section categorization:  Primary      Indication for procedure: 36 y.o. female at 2918w2d with breech presentation, rupture of membranes.  Procedure Details   The risks, benefits, complications, treatment options, and expected outcomes were discussed with the patient. Informed consent was obtained. The patient was taken to Operating Room, identified as [name] and the procedure verified as a cesarean delivery.   After administration of anesthesia, the patient was prepped and draped in the usual sterile manner, including a vaginal prep. A surgical time out was performed, with the pediatric team present. After confirming adequate anesthesia, a Pfannenstiel incision was made and carried down through the subcutaneous tissue to the fascia. Fascial incision was made and extended transversely. The fascia was separated from the underlying rectus tissue superiorly and inferiorly. The peritoneum was identified and entered. Peritoneal incision was extended longitudinally.  A low transverse uterine incision was made. Delivered from complete breech presentation was a live born female. Delayed cord clamping was performed for 60 seconds, while we sang happy birthday to  baby Teagan. The umbilical cord was doubly clamped and cut, and the baby was handed off to the awaitng pediatrician. The placenta was removed intact and appeared normal. The uterus was delivered from the abdominal cavity and cleared of clots, membranes, and debris. The uterus, tubes and ovaries appeared normal. The uterine incision was closed with running locking sutures of 0 Vicryl, and then a second, imbricating stitch was placed. Figure of eights were placed. Hemostasis was observed. The abdominal cavity was irrigated and evacuated of extraneous fluid. The uterus was returned to the abdominal cavity and again the incision was inspected for hemostasis, which was confirmed.  The paracolic gutters were cleaned. The fascia was then reapproximated with running suture of vicryl. 70cc of Long- and short-acting bupivicaine was injected circumferentially into the fascia.  After a change of gloves, the subcutaneous tissue was irrigated and reapproximated with 3-0 vicryl. The skin was closed with 4-0 Monocryl and 30cc of long- and short-acting bupivacaine injected into the skin and subcutaneous tissues.  The incision was covered with surgical glue.     Instrument, sponge, and needle counts were correct prior the abdominal closure and at the conclusion of the case.   I was present and performed this procedure in its entirety (including the singing).  ----- Ranae Plumberhelsea Pearse, MD Attending Obstetrician and Gynecologist [facility], Department of OB/GYN [facility]

## 2017-11-10 NOTE — H&P (Addendum)
OB History & Physical   History of Present Illness:  Chief Complaint:   HPI:  Michele Hicks is a 36 y.o. G1P0 female at 4150w2d dated by Patient's last menstrual period was 02/08/2017. consistent with a 9 wk ultrasound Estimated Date of Delivery: 11/15/17  She presents to L&D with complaints of clear fluid leaking since ~7:30am.  Mild cramping.  She is known breech and was scheduled for CS in 2 days.  +FM, no CTX, + LOF, no VB  Pregnancy Issues: 1. AMA 2. Breech   Maternal Medical History:   Past Medical History:  Diagnosis Date  . Headache    occassional migraines  . Sleep apnea    medical mouth gaurd    Past Surgical History:  Procedure Laterality Date  . WISDOM TOOTH EXTRACTION      Allergies  Allergen Reactions  . Codeine Other (See Comments)    Chest pain and chest tightness  . Tdap [Diphth-Acell Pertussis-Tetanus] Other (See Comments)    Seizures  . Penicillins Rash and Other (See Comments)    Has patient had a PCN reaction causing immediate rash, facial/tongue/throat swelling, SOB or lightheadedness with hypotension: Yes Has patient had a PCN reaction causing severe rash involving mucus membranes or skin necrosis: No Has patient had a PCN reaction that required hospitalization: No Has patient had a PCN reaction occurring within the last 10 years: Yes  If all of the above answers are "NO", then may proceed with Cephalosporin use.     Prior to Admission medications   Medication Sig Start Date End Date Taking? Authorizing Provider  fluticasone (FLONASE) 50 MCG/ACT nasal spray Place 1-2 sprays into both nostrils daily.    Yes [provider]  Prenatal Vit-Fe Fumarate-FA (MULTIVITAMIN-PRENATAL) 27-0.8 MG TABS tablet Take 1 tablet by mouth daily.    Yes [provider]  Probiotic Product (PROBIOTIC PO) Take 1 capsule by mouth daily.   Yes [provider]  ranitidine (ZANTAC) 150 MG tablet Take 150 mg by mouth 2 (two) times daily as  needed for heartburn.   Yes [provider]  Tetrahydrozoline HCl (VISINE OP) Place 2 drops into both eyes as needed (for dry eyes).   Yes [provider]     [facility]    Social History: She  reports that  has never smoked. she has never used smokeless tobacco. She reports that she does not drink alcohol or use drugs.  Family History: no gyn cancers, dementia in paternal grandmother  Review of Systems: A full review of systems was performed and negative except as noted in the HPI.     Physical Exam:  Vital Signs: BP 102/64 (BP Location: Left Arm)   Pulse 89   Temp 98.2 F (36.8 C) (Oral)   Resp 18   Ht 5\' 3"  (1.6 m)   Wt 66.7 kg (147 lb)   LMP 02/08/2017   BMI 26.04 kg/m  General: no acute distress.  HEENT: normocephalic, atraumatic Heart: regular rate & rhythm.  No murmurs/rubs/gallops Lungs: clear to auscultation bilaterally, normal respiratory effort Abdomen: soft, gravid, non-tender;  EFW: 7.1 Pelvic:   External: Normal external female genitalia  Cervix: Dilation: Closed /   /     + pooling, + nitrazine, no cord.   Extremities: non-tender, symmetric, no edema bilaterally.  DTRs: 2+ Neurologic: Alert & oriented x 3.    Results for orders placed or performed during the hospital encounter of 11/09/17 (from the past 24 hour(s))  Basic metabolic panel     Status: Abnormal   Collection Time: 11/09/17 10:58 AM  Result Value Ref Range   Sodium 134 (L) 135 - 145 mmol/L   Potassium 4.0 3.5 - 5.1 mmol/L   Chloride 102 101 - 111 mmol/L   CO2 23 22 - 32 mmol/L   Glucose, Bld 69 65 - 99 mg/dL   BUN 9 6 - 20 mg/dL   Creatinine, Ser 4.090.37 (L) 0.44 - 1.00 mg/dL   Calcium 8.4 (L) 8.9 - 10.3 mg/dL   GFR calc non Af Amer >60 >60 mL/min   GFR calc Af Amer >60 >60 mL/min   Anion gap 9 5 - 15  CBC     Status: Abnormal   Collection Time: 11/09/17 10:58 AM  Result Value Ref Range   WBC 8.4 3.6 - 11.0 K/uL   RBC 3.77 (L) 3.80 - 5.20  MIL/uL   Hemoglobin 12.5 12.0 - 16.0 g/dL   HCT 81.135.3 91.435.0 - 78.247.0 %   MCV 93.4 80.0 - 100.0 fL   MCH 33.0 26.0 - 34.0 pg   MCHC 35.3 32.0 - 36.0 g/dL   RDW 95.612.8 21.311.5 - 08.614.5 %   Platelets 261 150 - 440 K/uL  RPR     Status: None   Collection Time: 11/09/17 10:58 AM  Result Value Ref Range   RPR Ser Ql Non Reactive Non Reactive  Type and screen [facility]     Status: None   Collection Time: 11/09/17 10:58 AM  Result Value Ref Range   ABO/RH(D) AB POS    Antibody Screen NEG    Sample Expiration 11/12/2017    Extend sample reason PREGNANT WITHIN 3 MONTHS, UNABLE TO EXTEND     Pertinent Results:  Prenatal Labs: Blood type/Rh AB+  Antibody screen neg  Rubella Immune  Varicella Immune  RPR NR  HBsAg Neg  HIV NR  GC neg  Chlamydia neg  Genetic screening Negative, informaseq XX diploid  1 hour GTT 210  3 hour GTT 81 / 144 / 128 / 85  GBS negative   FHT: 130 mod + accels no decels TOCO: q 5min SVE:  Dilation: Closed    Breech by bedside ultrasound   Assessment:  Michele DroneKristen C Crean is a 36 y.o. G1P0 female at 5711w2d with breech, PROM.   Plan:  1. Admit to Labor & Delivery 2. NPO, IVF 3. Labs done yesterday 4. Consents obtained/confirmed 5. Continuous efm/toco until OR 6. OR team, anesthesia informed and preparing. 7. Gent/Clinda/Azithromycin  ----- Ranae Plumberhelsea Whiteside, MD Attending Obstetrician and Gynecologist [facility], Department of OB/GYN [facility]

## 2017-11-11 LAB — CBC
HCT: 26.8 % — ABNORMAL LOW (ref 35.0–47.0)
Hemoglobin: 9.5 g/dL — ABNORMAL LOW (ref 12.0–16.0)
MCH: 32.8 pg (ref 26.0–34.0)
MCHC: 35.5 g/dL (ref 32.0–36.0)
MCV: 92.5 fL (ref 80.0–100.0)
PLATELETS: 182 10*3/uL (ref 150–440)
RBC: 2.89 MIL/uL — AB (ref 3.80–5.20)
RDW: 12.6 % (ref 11.5–14.5)
WBC: 11.9 10*3/uL — AB (ref 3.6–11.0)

## 2017-11-11 MED ORDER — MORPHINE SULFATE 2 MG/ML IV SOLN
INTRAVENOUS | Status: DC
  Administered 2017-11-11: 6.4 mg via INTRAVENOUS
  Administered 2017-11-11: 10:00:00 via INTRAVENOUS
  Administered 2017-11-11 – 2017-11-12 (×2): 2 mg via INTRAVENOUS
  Filled 2017-11-11: qty 30

## 2017-11-11 MED ORDER — MORPHINE SULFATE (PF) 2 MG/ML IV SOLN
INTRAVENOUS | Status: AC
  Administered 2017-11-11: 2 mg via INTRAVENOUS
  Filled 2017-11-11: qty 1

## 2017-11-11 MED ORDER — NALOXONE HCL 0.4 MG/ML IJ SOLN: 0.4000 mg | INTRAMUSCULAR | Status: DC | PRN

## 2017-11-11 MED ORDER — MORPHINE SULFATE-NACL 0.5-0.9 MG/ML-% IV SOSY: 2.0000 mg | PREFILLED_SYRINGE | Freq: Once | INTRAVENOUS | Status: DC

## 2017-11-11 MED ORDER — MORPHINE SULFATE (PF) 2 MG/ML IV SOLN: 2.0000 mg | Freq: Once | INTRAVENOUS | Status: AC

## 2017-11-11 MED ORDER — CYCLOBENZAPRINE HCL 10 MG PO TABS
10.0000 mg | ORAL_TABLET | Freq: Three times a day (TID) | ORAL | Status: DC | PRN
  Administered 2017-11-11 – 2017-11-13 (×7): 10 mg via ORAL
  Filled 2017-11-11 (×9): qty 1

## 2017-11-11 MED ORDER — MORPHINE SULFATE (PF) 2 MG/ML IV SOLN
2.0000 mg | Freq: Once | INTRAVENOUS | Status: AC
  Administered 2017-11-11: 2 mg via INTRAVENOUS
  Filled 2017-11-11: qty 1

## 2017-11-11 NOTE — Anesthesia Postprocedure Evaluation (Signed)
Anesthesia Post Note  Patient: Portia C Gethers  Procedure(s) Performed: CESAREAN SECTION (N/A )  Patient location during evaluation: Mother Baby Anesthesia Type: Spinal Level of consciousness: oriented and awake and alert Pain management: pain level controlled Vital Signs Assessment: post-procedure vital signs reviewed and stable Respiratory status: spontaneous breathing, respiratory function stable and nonlabored ventilation Cardiovascular status: blood pressure returned to baseline and stable Postop Assessment: no headache, no backache and spinal receding Anesthetic complications: no   Worsening pain overnight, RN reports that initially postop pt was refusing any opioid pain medication because she was worried about getting sleepy, then required IV morphine and is being started on PCA this morning   Last Vitals:  Vitals:   11/11/17 0248 11/11/17 0541  BP: (!) 92/51 (!) 95/50  Pulse: 85 74  Resp: 18 18  Temp: 36.8 C 36.6 C  SpO2: 98% 100%    Last Pain:  Vitals:   11/11/17 0715  TempSrc:   PainSc: (P) 10-Worst pain ever                 Sade Hollon

## 2017-11-11 NOTE — Progress Notes (Addendum)
Subjective: Postpartum Day 1 Cesarean Delivery Patient reports neck pain and meds are not holding as she has been on Toradol and no narcs since she is allergie to Codeine and did not want to take any hydro or oxycodone. Pt is writhing in pain and scoring her pain an 8 on 1-10 scale.   Objective: Vital signs in last 24 hours: Temp:  [97.7 F (36.5 C)-98.5 F (36.9 C)] 97.9 F (36.6 C) (12/09 0541) Pulse Rate:  [71-96] 74 (12/09 0541) Resp:  [10-27] 18 (12/09 0541) BP: (64-110)/(40-78) 95/50 (12/09 0541) SpO2:  [95 %-100 %] 100 % (12/09 0541) Weight:  [66.7 kg (147 lb)] 66.7 kg (147 lb) (12/08 0915)  Physical Exam:  General: alert, cooperative and appears stated age  Heart:S1S2, RRR, no M/R/G Lungs:CTA bilat, no W/R/R Lochia: mod, no clots Uterine Fundus:FF, U-1 Incision: Intact, C/D DVT Evaluation: Neg Homans   Recent Labs    11/09/17 1058 11/11/17 0547  HGB 12.5 9.5*  HCT 35.3 26.8*    Assessment/Plan: 1. Status post Cesarean section. 2. Neck pain P:1. Will try Morphine 2 mg IV for pain 2. Flexeril 10 mg po tid prn for neck pain 3. Have massage therapist work on pt when available 4. Call for any worsening of pain meds. 5. Will start pt on a Morphine PCA pump since she is rating her pain an 8 on 1-10 scale Sharee Pimplearon W.Kayslee Furey, RN, MSN, CNM, FNP  Sharee Pimplearon W Darrian Grzelak 11/11/2017, 6:42 AM

## 2017-11-11 NOTE — Progress Notes (Signed)
To B/P to report to C. Jones that Pt. Is having neck pain. Pt. Is teary. Also reported Pt. B/P's and refusal to take PRN Roxi due to Codeine allergy. [name]. Jones states she will place order for Flexeril.

## 2017-11-11 NOTE — Anesthesia Post-op Follow-up Note (Signed)
  Anesthesia Pain Follow-up Note  Patient: Michele Hicks  Day #: 1  Date of Follow-up: 11/11/2017 Time: 9:25 AM  Last Vitals:  Vitals:   11/11/17 0248 11/11/17 0541  BP: (!) 92/51 (!) 95/50  Pulse: 85 74  Resp: 18 18  Temp: 36.8 C 36.6 C  SpO2: 98% 100%    Level of Consciousness: alert  Pain: severe   Side Effects:None  Catheter Site Exam:clean, dry     Plan: D/C from anesthesia care at surgeon's request   Talked with RN this morning, pt required IV morphine overnight and is being started on PCA this morning, initially after CS was refusing opioid pain medication out of concern for sedation so may have just gotten behind on pain management  Michele Hicks

## 2017-11-11 NOTE — Progress Notes (Signed)
Pt. With c/o right side of neck hurting when she moves. Pt. States she has had this in the past and Tylenol has helped. Tylenol given as per scheduled order and heating pad applied with Pt. Stated relief of neck pain. C/O abd. Pain 6 of 7. Pt. States abd. Feels, "sore". Offered PRN pain medicine Roxy. And pt refused because she has had codeine in the past and had SOB. Pt. States she wants to wait until 0200 for Toradol. If no relief after that, I will call M.D. To see if Pt can be ordered another pain medication. Will follow closely.

## 2017-11-11 NOTE — Progress Notes (Signed)
Called Michele Hicks back and informed her that Pt. States Morphine makes her nauseated. [name]. Hicks stated Pt. Was to receive PRN Zofran prior to Morphine.

## 2017-11-11 NOTE — Progress Notes (Signed)
Called C.Jones CNM and informed her that Pt. Is now having sharp pain just below the breast line. CNM states she will order Morphine.

## 2017-11-11 NOTE — Progress Notes (Signed)
Pt. States that neck pain and abd. Pain is improved. She rates her abd. Pain at at a 3 at this time and denies need for further medication.

## 2017-11-12 ENCOUNTER — Inpatient Hospital Stay
Admission: RE | Admit: 2017-11-12 | Payer: BLUE CROSS/BLUE SHIELD | Source: Ambulatory Visit | Admitting: Obstetrics and Gynecology

## 2017-11-12 ENCOUNTER — Encounter: Payer: Self-pay | Admitting: Obstetrics & Gynecology

## 2017-11-12 MED ORDER — HYDROMORPHONE HCL 2 MG PO TABS
2.0000 mg | ORAL_TABLET | ORAL | Status: DC | PRN
  Administered 2017-11-12 – 2017-11-13 (×5): 2 mg via ORAL
  Filled 2017-11-12 (×5): qty 1

## 2017-11-12 MED ORDER — MORPHINE SULFATE 2 MG/ML IV SOLN
INTRAVENOUS | Status: DC
  Administered 2017-11-12: 2 mL via INTRAVENOUS
  Filled 2017-11-12 (×2): qty 30

## 2017-11-12 MED FILL — Morphine Sulfate Inj 2 MG/ML: INTRAVENOUS | Qty: 30 | Status: AC

## 2017-11-12 NOTE — Progress Notes (Signed)
Subjective: Postpartum Day 2: Cesarean Delivery Patient reports  Just came off PCA . PAin ok  Objective: Vital signs in last 24 hours: Temp:  [98 F (36.7 C)-98.9 F (37.2 C)] 98 F (36.7 C) (12/10 1136) Pulse Rate:  [70-99] 70 (12/10 0848) Resp:  [18-20] 18 (12/10 1136) BP: (93-98)/(47-62) 98/54 (12/10 1136) SpO2:  [92 %-100 %] 99 % (12/10 1136) FiO2 (%):  [99 %] 99 % (12/10 0015)  Physical Exam:  General: alert and cooperative Lochia: appropriate Uterine Fundus: firm Incision: healing well DVT Evaluation: No evidence of DVT seen on physical exam.  Recent Labs    11/11/17 0547  HGB 9.5*  HCT 26.8*    Assessment/Plan: Status post Cesarean section. Doing well postoperatively.  Continue current care.  Ihor Austinhomas J Carlson Belland 11/12/2017, 2:30 PM

## 2017-11-12 NOTE — Lactation Note (Signed)
This note was copied from a baby's chart. Lactation Consultation Note  Patient Name: Girl Rochele RaringKristen Liberman ZOXWR'UToday's Date: 11/12/2017   Parents concerned that Kenise Cardinaleagan Karter is cluster feeding.  Reviewed routine newborn feeding patterns and normal course of lactation.  Discussed supply and demand and need to breast feed frequently to bring in mature milk and ensure a plentiful milk supply.  Mom has red, large, elongated nipples.  Venia Minkseagan has a thin, tight frenulum which is attached near the tip of her tongue causing the tongue to curl under as she attempts to lift it which could be hindering achievement of deep latch of mom's long nipple.  This has been communicated to Dr. Princess BruinsBoylston and Dr. Athena MasseBonney and to the parents.  Information given on clipping and lazering techniques.  Mom just recently voiced tender nipples.  Demonstrated how to hand express colostrum and rub on nipples.  Coconut oil given with instructions in use.  Lactation name and number on white board and encouraged to call for questions, concerns or assistance.      Maternal Data    Feeding Feeding Type: Breast Fed Length of feed: 32 min(per mom)  LATCH Score                   Interventions    Lactation Tools Discussed/Used     Consult Status      Louis MeckelWilliams, Raymund Manrique Kay 11/12/2017, 9:18 PM

## 2017-11-13 MED ORDER — CYCLOBENZAPRINE HCL 10 MG PO TABS: 10.0000 mg | ORAL_TABLET | Freq: Three times a day (TID) | ORAL | 0 refills | Status: DC | PRN

## 2017-11-13 MED ORDER — HYDROMORPHONE HCL 2 MG PO TABS: 2.0000 mg | ORAL_TABLET | ORAL | 0 refills | Status: DC | PRN

## 2017-11-13 NOTE — Progress Notes (Signed)
Pt discharged with infant.  Discharge instructions, prescriptions and follow up appointment given to and reviewed with pt. Pt verbalized understanding. Escorted out by auxillary. 

## 2017-11-13 NOTE — Discharge Instructions (Signed)
After Your Delivery Discharge Instructions  Postpartum: Care Instructions Your Care Instructions  After childbirth (postpartum period), your body goes through many changes. Some of these changes happen over several weeks. In the hours after delivery, your body will begin to recover from childbirth while it prepares to breastfeed your newborn. You may feel emotional during this time. Your hormones can shift your mood without warning for no clear reason.  In the first couple of weeks after childbirth, many women have emotions that change from happy to sad. You may find it hard to sleep. You may cry a lot. This is called the "baby blues." These overwhelming emotions often go away within a couple of days or weeks. But it's important to discuss your feelings with your doctor.  You should call your doctor if you have unrelieved feelings of:  Inability to cope  Sadness  Anxiety  Lack of interest in baby  Insomnia  Crying  It is easy to get too tired and overwhelmed during the first weeks after childbirth. Don't try to do too much. Get rest whenever you can, accept help from others, and eat well and drink plenty of fluids.  About 4 to 6 weeks after your baby's birth, you will have a follow-up visit with your doctor. This visit is your time to talk to your doctor about anything you are concerned or curious about.  Follow-up care is a key part of your treatment and safety. Be sure to make and go to all appointments, and call your doctor if you are having problems. It's also a good idea to know your test results and keep a list of the medicines you take.  How can you care for yourself at home?  Sleep or rest when your baby sleeps.  Get help with household chores from family or friends, if you can. Do not try to do it all yourself.  If you have hemorrhoids or swelling or pain around the opening of your vagina, try using cold and heat. You can put ice or a cold pack on the area for 10 to 20  minutes at a time. Put a thin cloth between the ice and your skin. Also try sitting in a few inches of warm water (sitz bath) 3 times a day and after bowel movements.  Take pain medicines exactly as directed.  If your provider gave you a prescription medicine for pain, take it as prescribed.  If you are not taking a prescription pain medicine, ask your provider if you can take an over-the-counter medicine.  Eat more fiber to avoid constipation. Include foods such as whole-grain breads and cereals, raw vegetables, raw and dried fruits, and beans.  Drink plenty of fluids, enough so that your urine is light yellow or clear like water. If you have kidney, heart, or liver disease and have to limit fluids, talk with your doctor before you increase the amount of fluids you drink.  Do not put anything in the vagina for 6 weeks. This means no sex, no tampons, no douching, and no enemas.  If you have stitches, keep the area clean by pouring or spraying warm water over the area outside your vagina and anus after you use the toilet.  No strenuous activity or heavy lifting for 6 weeks   No tub baths; showers only  Continue prenatal vitamin and iron.  If breastfeeding:  Increase calories and fluids while breastfeeding.  You may have a slight fever when your milk comes in, but it should go  away on its own. If it does not, and rises above 101.0 please call the doctor.  For breastfeeding concerns, the lactation consultant can be reached at [phone].  For concerns about your baby, please call your pediatrician.   Keep a list of questions to bring to your postpartum visit. Your questions might be about:  Changes in your breasts, such as lumps or soreness.  When to expect your menstrual period to start again.  What form of birth control is best for you.  Weight you have put on during the pregnancy.  Exercise options.  What foods and drinks are best for you, especially if you are  breastfeeding.  Problems you might be having with breastfeeding.  When you can have sex. Some women may want to talk about lubricants for the vagina.  Any feelings of sadness or restlessness that you are having.   When should you call for help?  Call 911 anytime you think you may need emergency care. For example, call if:  You have thoughts of harming yourself, your baby, or another person.  You passed out (lost consciousness).  Call your care provider now or seek immediate medical care if:  If you have heavy bleeding such that you are soaking 1 pad in an hour for 2 hours  You are dizzy or lightheaded, or you feel like you may faint.  You have a fever; a temperature of 101.0 F or greater  Chills  Difficulty urinating  Headache unrelieved by "pain meds"   Visual changes  Pain in the right side of your belly near your ribs  Breasts reddened, hard, hot to the touch or any other breast concerns  Nipple discharge which is foul-smelling or contains pus   Increased pain at the site of the surgical incision   Incision drainage or problems  New pain unrelieved with recommended over-the-counter dosages  Difficulty breathing with or without chest pain   New leg pain, swelling, or redness, especially if it is only on one leg  Any other concerns  Watch closely for changes in your health, and be sure to contact your provider if:  You have new or worse vaginal discharge.  You feel sad or depressed.  You are having problems with your breasts or breastfeeding.    Cesarean Section: What to Expect at Home Your Recovery  A cesarean section, or C-section, is surgery to deliver your baby through a cut, called an incision, that the doctor makes in your lower belly and uterus. You may have some pain in your lower belly and need pain medicine for 1 to 2 weeks.  You can expect some vaginal bleeding for several weeks. You will probably need about 6 weeks to fully recover.  It  is important to take it easy while the incision is healing. Avoid heavy lifting, strenuous activities, or exercises that strain the belly muscles while you are recovering. Ask a family member or friend for help with housework, cooking, and shopping.  This care sheet gives you a general idea about how long it will take for you to recover. But each person recovers at a different pace.   Follow the steps below to get better as quickly as possible.  How can you care for yourself at home? Activity  Rest when you feel tired. Getting enough sleep will help you recover.  Try to walk each day. Start by walking a little more than you did the day before. Bit by bit, increase the amount you walk. Walking boosts  blood flow and helps prevent pneumonia, constipation, and blood clots in your legs or lungs.  Avoid strenuous activities, such as bicycle riding, jogging, weightlifting, and aerobic exercise, for 6 weeks or until your doctor says it is okay.  Until your doctor says it is okay, do not lift anything heavier than your baby and the carrier.  Do not do sit-ups or other exercises that strain the belly muscles for 6 weeks or until your doctor says it is okay.  Hold a pillow over your incision when you cough or take deep breaths. This will support your belly and decrease your pain.  You may shower as usual. Pat the incision dry when you are done.  You will have some vaginal bleeding. Wear sanitary pads. Do not douche or use tampons until your provider says it is okay.  Ask your provider when you can drive again.  You will probably need to take at least 6 weeks off work. It depends on the type of work you do and how you feel.  "Pelvic Rest" for 6 weeks: This means nothing in the vagina (no sex, tampons, douching, etc.) and no tub baths or swimming for the next 6 weeks.   Diet  You can eat your normal diet. If your stomach is upset, try bland, low-fat foods like plain rice, broiled chicken, toast,  and yogurt.  Drink plenty of fluids (unless your doctor tells you not to).  You may notice that your bowel movements are not regular right after your surgery. This is common. Try to avoid constipation and straining with bowel movements. You may want to take a fiber supplement every day. If you have not had a bowel movement after a couple of days, ask your doctor about taking a mild laxative.  If you are breastfeeding, do not drink any alcohol.  Medicines  Your provider will tell you if and when you can restart your medicines. He or she will also give you instructions about taking any new medicines.  Take pain medicines as directed:  You have been prescribed 2mg  Dilaudid. Take one tablet up to every 4-6 hours as needed for moderate to severe pain. You have also been prescribed 10mg  Flexeril for neck pain. Take one tablet up to every 8 hours as needed for pain.    You have been given a prescription for a narcotic pain medication (i.e. Oxycodone, dilaudid, percocet, etc). These medications are a controlled substance and should only be used as directed by your provider.  Please store your pain medicine in a locked location, out of reach of children and other adults.  You should only use the narcotic medicine as long as needed for pain-you do not have to finish all the pills given to you.  When you no longer need the narcotic pain medication, please dispose of the pills by flushing them down the toilet.  You can also bring them  to a police station that accepts them.  All narcotics have potential for overdose and death. Please use only as directed.  If you think your pain medicine is making you sick to your stomach:  Take your medicine after meals (unless your doctor has told you not to).  Ask your doctor for a different pain medicine.  If your doctor prescribed antibiotics, take them as directed. Do not stop taking them just because you feel better. You need to take the full course of  antibiotics.  Incision care  Wash the area daily with warm, soapy water, and pat  it dry. Don't use hydrogen peroxide or alcohol, which can slow healing. You may cover the area with a gauze bandage if it weeps or rubs against clothing. Change the bandage every day.  Keep the area clean and dry.  Other instructions If you breastfeed your baby, you may be more comfortable while you are healing if you place the baby so that he or she is not resting on your belly. Try tucking your baby under your arm, with his or her body along the side you will be feeding on. Support your baby's upper body with your arm. With that hand you can control your baby's head to bring his or her mouth to your breast. This is sometimes called the football hold.  Follow-up care is a key part of your treatment and safety. Be sure to make and go to all appointments, and call your doctor if you are having problems. It's also a good idea to know your test results and keep a list of the medicines you take.  When should you call for help? Call 911 anytime you think you may need emergency care. For example, call if:  You passed out (lost consciousness).  You have symptoms of a blood clot in your lung (called a pulmonary embolism). These may include:  Sudden chest pain.  Trouble breathing.  Coughing up blood.  You have thoughts of harming yourself, your baby, or another person.  Call your care provider now or seek immediate medical care if: You have severe vaginal bleeding. This means that you are soaking through a pad every hour for 2 or more hours. You are dizzy or lightheaded, or you feel like you may faint. You have new or more belly pain. You have loose stitches, or your incision comes open. You have symptoms of infection, such as: Increased pain, swelling, warmth, or redness. Red streaks leading from the incision. Pus draining from the incision. A fever. You have symptoms of a blood clot in your leg (called a  deep vein thrombosis), such as: Pain in your calf, back of the knee, thigh, or groin. Redness and swelling in your leg or groin.   Watch closely for changes in your health, and be sure to contact your doctor if: You feel sad, anxious, or hopeless for more than a few days. You do not get better as expected.

## 2019-01-14 ENCOUNTER — Other Ambulatory Visit: Payer: Self-pay | Admitting: Obstetrics and Gynecology

## 2019-01-14 DIAGNOSIS — N6310 Unspecified lump in the right breast, unspecified quadrant: Secondary | ICD-10-CM

## 2019-01-22 ENCOUNTER — Other Ambulatory Visit: Payer: BLUE CROSS/BLUE SHIELD

## 2019-01-23 ENCOUNTER — Other Ambulatory Visit: Payer: BLUE CROSS/BLUE SHIELD

## 2019-01-30 ENCOUNTER — Ambulatory Visit
Admission: RE | Admit: 2019-01-30 | Discharge: 2019-01-30 | Disposition: A | Discharge status: Home / Self Care | Payer: BLUE CROSS/BLUE SHIELD | Source: Ambulatory Visit | Attending: Obstetrics and Gynecology | Admitting: Obstetrics and Gynecology

## 2019-01-30 DIAGNOSIS — N6310 Unspecified lump in the right breast, unspecified quadrant: Secondary | ICD-10-CM | POA: Diagnosis present

## 2020-03-28 NOTE — H&P (Signed)
OB History & Physical   History of Present Illness:  Chief Complaint:   HPI:  Michele Hicks is a 39 y.o. G1P1001 female at 56 weeks dated by  LMP: 07/06/2019 EDC: 04/11/2020 FOB: Ronaldo Miyamoto Girl "vivian or presley"    She presents to L&D for scheduled repeat cesarean delivery  +FM, no CTX, no LOF, no VB  Pregnancy Issues: 1. S<D - has small babies.  No lagging growth, consistently <20% 2. History of cesarean for breech 3. Codeine and oxycodone reactions - use morphine for narcotic postop.  Maternal Medical History:   Past Medical History:  Diagnosis Date  . Headache    occassional migraines  . Sleep apnea    medical mouth gaurd    Past Surgical History:  Procedure Laterality Date  . CESAREAN SECTION N/A 11/10/2017   Procedure: CESAREAN SECTION;  Surgeon: Doshi, Elenora Fender, MD;  Location: [facility] ORS;  Service: Obstetrics;  Laterality: N/A;  . WISDOM TOOTH EXTRACTION      Allergies  Allergen Reactions  . Codeine Other (See Comments)    Chest pain and chest tightness  . Tdap [Tetanus-Diphth-Acell Pertussis] Other (See Comments)    Seizures  . Penicillins Rash and Other (See Comments)    Has patient had a PCN reaction causing immediate rash, facial/tongue/throat swelling, SOB or lightheadedness with hypotension: Yes Has patient had a PCN reaction causing severe rash involving mucus membranes or skin necrosis: No Has patient had a PCN reaction that required hospitalization: No Has patient had a PCN reaction occurring within the last 10 years: Yes  If all of the above answers are "NO", then may proceed with Cephalosporin use.     Prior to Admission medications   Medication Sig Start Date End Date Taking? Authorizing Provider  fluticasone (FLONASE) 50 MCG/ACT nasal spray Place 1-2 sprays into both nostrils daily.    Yes [provider]  pantoprazole (PROTONIX) 40 MG tablet Take 40 mg by mouth daily.   Yes [provider]  Prenatal Vit-Fe Fumarate-FA  (MULTIVITAMIN-PRENATAL) 27-0.8 MG TABS tablet Take 1 tablet by mouth daily.    Yes [provider]     Prenatal care site: [facility]   Pertinent Results:  Prenatal Labs: Blood type/Rh AB positive  Antibody screen neg  Rubella Immune  Varicella Immune  RPR NR  HBsAg Neg  HIV NR  GC neg  Chlamydia neg  Genetic screening Negative NIPT, XX  1 hour GTT 85  3 hour GTT --  GBS negative      Assessment:  Michele Hicks is a 39 y.o. G74P1001 female at 24 completed weeks for repeat cesarean delivery   Plan:  1. Admit to Labor & Delivery 2. CBC, T&S, NPO, ABX, IVF, prep for surgery 3. GBS negative 4. Consents obtained. 5. Continuous efm/toco until move to OR 6. To OR when ready  ----- Ranae Plumber, MD Attending Obstetrician and Gynecologist [facility], Department of OB/GYN [facility]

## 2020-03-29 ENCOUNTER — Encounter: Payer: Self-pay | Admitting: *Deleted

## 2020-03-29 ENCOUNTER — Other Ambulatory Visit: Payer: Self-pay

## 2020-03-29 ENCOUNTER — Encounter
Admission: RE | Admit: 2020-03-29 | Discharge: 2020-03-29 | Disposition: A | Discharge status: Home / Self Care | Payer: BC Managed Care – PPO | Source: Ambulatory Visit | Attending: Obstetrics & Gynecology | Admitting: Obstetrics & Gynecology

## 2020-03-29 DIAGNOSIS — Z01812 Encounter for preprocedural laboratory examination: Secondary | ICD-10-CM | POA: Insufficient documentation

## 2020-03-29 NOTE — Patient Instructions (Signed)
Your procedure is scheduled on: Mon 5/3 Report to [facility]     Call [phone]  For escort to L&D   Remember: Instructions that are not followed completely may result in serious medical risk,  up to and including death, or upon the discretion of your surgeon and anesthesiologist your  surgery may need to be rescheduled.     _X__ 1. Do not eat food after midnight the night before your procedure.                 No gum chewing or hard candies. You may drink clear liquids up to 2 hours                 before you are scheduled to arrive for your surgery- DO not drink clear                 liquids within 2 hours of the start of your surgery.                 Clear Liquids include:  water, apple juice without pulp, clear Gatorade, G2 or                  Gatorade Zero (avoid Red/Purple/Blue), Black Coffee or Tea (Do not add                 anything to coffee or tea). _____2.   Complete the carbohydrate drink provided to you, 2 hours before arrival.  __X__2.  On the morning of surgery brush your teeth with toothpaste and water, you                may rinse your mouth with mouthwash if you wish.  Do not swallow any toothpaste of mouthwash.     ___ 3.  No Alcohol for 24 hours before or after surgery.   ___ 4.  Do Not Smoke or use e-cigarettes For 24 Hours Prior to Your Surgery.                 Do not use any chewable tobacco products for at least 6 hours prior to                 Surgery.  _X__  5.  Do not use any recreational drugs (marijuana, cocaine, heroin, ecstacy, MDMA or other)                For at least one week prior to your surgery.  Combination of these drugs with anesthesia                May have life threatening results.  ____  6.  Bring all medications with you on the day of surgery if instructed.   _x___  7.  Notify your doctor if there is any change in your medical condition      (cold, fever, infections).     Do not wear jewelry, make-up,  hairpins, clips or nail polish. Do not wear lotions, powders, or perfumes. You may wear deodorant. Do not shave 48 hours prior to surgery.  Do not bring valuables to the hospital.    [facility] is not responsible for any belongings or valuables.  Contacts, dentures or bridgework may not be worn into surgery. Leave your suitcase in the car. After surgery it may be brought to your room. For patients admitted to the hospital, discharge time is determined by your treatment team.   Patients discharged the day of surgery will  not be allowed to drive home.   Make arrangements for someone to be with you for the first 24 hours of your Same Day Discharge.    Please read over the following fact sheets that you were given:       _x___ Take these medicines the morning of surgery with A SIP OF WATER:    1. flonase if needed  2.   3.   4.  5.  6.  ____ Fleet Enema (as directed)   __x__ Use CHG Soap (or wipes) as directed  ____ Use Benzoyl Peroxide Gel as instructed  ____ Use inhalers on the day of surgery  ____ Stop metformin 2 days prior to surgery    ____ Take 1/2 of usual insulin dose the night before surgery. No insulin the morning          of surgery.   ____ Stop Coumadin/Plavix/aspirin    ____ Stop Anti-inflammatories     _x___ Stop supplements until after surgery.  Vitamin c  ____ Bring C-Pap to the hospital.

## 2020-03-30 ENCOUNTER — Other Ambulatory Visit: Payer: Self-pay | Admitting: Obstetrics and Gynecology

## 2020-03-30 DIAGNOSIS — Z3689 Encounter for other specified antenatal screening: Secondary | ICD-10-CM

## 2020-04-01 ENCOUNTER — Ambulatory Visit
Admission: RE | Admit: 2020-04-01 | Discharge: 2020-04-01 | Disposition: A | Discharge status: Home / Self Care | Payer: BC Managed Care – PPO | Source: Ambulatory Visit | Attending: Obstetrics and Gynecology | Admitting: Obstetrics and Gynecology

## 2020-04-01 ENCOUNTER — Other Ambulatory Visit: Payer: Self-pay | Admitting: Obstetrics and Gynecology

## 2020-04-01 ENCOUNTER — Other Ambulatory Visit
Admission: RE | Admit: 2020-04-01 | Discharge: 2020-04-01 | Disposition: A | Discharge status: Home / Self Care | Payer: BC Managed Care – PPO | Source: Ambulatory Visit | Attending: Obstetrics & Gynecology | Admitting: Obstetrics & Gynecology

## 2020-04-01 ENCOUNTER — Other Ambulatory Visit: Admission: RE | Admit: 2020-04-01 | Payer: BC Managed Care – PPO | Source: Ambulatory Visit

## 2020-04-01 ENCOUNTER — Other Ambulatory Visit: Payer: Self-pay

## 2020-04-01 DIAGNOSIS — O365990 Maternal care for other known or suspected poor fetal growth, unspecified trimester, not applicable or unspecified: Secondary | ICD-10-CM | POA: Insufficient documentation

## 2020-04-01 DIAGNOSIS — Z20822 Contact with and (suspected) exposure to covid-19: Secondary | ICD-10-CM | POA: Diagnosis not present

## 2020-04-01 DIAGNOSIS — O321XX0 Maternal care for breech presentation, not applicable or unspecified: Secondary | ICD-10-CM | POA: Insufficient documentation

## 2020-04-01 DIAGNOSIS — Z3A38 38 weeks gestation of pregnancy: Secondary | ICD-10-CM | POA: Diagnosis not present

## 2020-04-01 DIAGNOSIS — Z01812 Encounter for preprocedural laboratory examination: Secondary | ICD-10-CM | POA: Insufficient documentation

## 2020-04-01 DIAGNOSIS — Z3689 Encounter for other specified antenatal screening: Secondary | ICD-10-CM

## 2020-04-01 LAB — SARS CORONAVIRUS 2 (TAT 6-24 HRS): SARS Coronavirus 2: NEGATIVE

## 2020-04-02 ENCOUNTER — Other Ambulatory Visit: Payer: BC Managed Care – PPO

## 2020-04-05 ENCOUNTER — Encounter
Admission: RE | Disposition: A | Discharge status: Home / Self Care | Payer: Self-pay | Source: Home / Self Care | Attending: Obstetrics and Gynecology

## 2020-04-05 ENCOUNTER — Inpatient Hospital Stay
Admission: RE | Admit: 2020-04-05 | Discharge: 2020-04-07 | DRG: 787 | Disposition: A | Discharge status: Home / Self Care | Payer: BC Managed Care – PPO | Attending: Obstetrics and Gynecology | Admitting: Obstetrics and Gynecology

## 2020-04-05 ENCOUNTER — Inpatient Hospital Stay: Payer: BC Managed Care – PPO | Admitting: Anesthesiology

## 2020-04-05 ENCOUNTER — Encounter: Payer: Self-pay | Admitting: Obstetrics & Gynecology

## 2020-04-05 ENCOUNTER — Other Ambulatory Visit: Payer: Self-pay

## 2020-04-05 DIAGNOSIS — O321XX0 Maternal care for breech presentation, not applicable or unspecified: Secondary | ICD-10-CM | POA: Diagnosis present

## 2020-04-05 DIAGNOSIS — Z20822 Contact with and (suspected) exposure to covid-19: Secondary | ICD-10-CM | POA: Diagnosis present

## 2020-04-05 DIAGNOSIS — D62 Acute posthemorrhagic anemia: Secondary | ICD-10-CM | POA: Diagnosis not present

## 2020-04-05 DIAGNOSIS — O9081 Anemia of the puerperium: Secondary | ICD-10-CM | POA: Diagnosis not present

## 2020-04-05 DIAGNOSIS — Z880 Allergy status to penicillin: Secondary | ICD-10-CM

## 2020-04-05 DIAGNOSIS — O0993 Supervision of high risk pregnancy, unspecified, third trimester: Secondary | ICD-10-CM

## 2020-04-05 DIAGNOSIS — O34211 Maternal care for low transverse scar from previous cesarean delivery: Secondary | ICD-10-CM | POA: Diagnosis present

## 2020-04-05 DIAGNOSIS — Z3A39 39 weeks gestation of pregnancy: Secondary | ICD-10-CM | POA: Diagnosis not present

## 2020-04-05 HISTORY — PX: BLADDER INSTILLATION: SHX6893

## 2020-04-05 LAB — TYPE AND SCREEN
ABO/RH(D): AB POS
Antibody Screen: NEGATIVE

## 2020-04-05 LAB — CBC
HCT: 34.4 % — ABNORMAL LOW (ref 36.0–46.0)
Hemoglobin: 11.9 g/dL — ABNORMAL LOW (ref 12.0–15.0)
MCH: 31.1 pg (ref 26.0–34.0)
MCHC: 34.6 g/dL (ref 30.0–36.0)
MCV: 89.8 fL (ref 80.0–100.0)
Platelets: 257 10*3/uL (ref 150–400)
RBC: 3.83 MIL/uL — ABNORMAL LOW (ref 3.87–5.11)
RDW: 13.5 % (ref 11.5–15.5)
WBC: 9.5 10*3/uL (ref 4.0–10.5)
nRBC: 0 % (ref 0.0–0.2)

## 2020-04-05 LAB — RESPIRATORY PANEL BY RT PCR (FLU A&B, COVID)
Influenza A by PCR: NEGATIVE
Influenza B by PCR: NEGATIVE
SARS Coronavirus 2 by RT PCR: NEGATIVE

## 2020-04-05 LAB — RPR: RPR Ser Ql: NONREACTIVE

## 2020-04-05 SURGERY — Surgical Case
Anesthesia: Spinal

## 2020-04-05 MED ORDER — PHENYLEPHRINE HCL (PRESSORS) 10 MG/ML IV SOLN
INTRAVENOUS | Status: DC | PRN
  Administered 2020-04-05 (×2): 100 ug via INTRAVENOUS

## 2020-04-05 MED ORDER — ACETAMINOPHEN 650 MG RE SUPP
650.0000 mg | RECTAL | Status: AC
  Administered 2020-04-05: 650 mg via RECTAL
  Filled 2020-04-05 (×2): qty 1

## 2020-04-05 MED ORDER — COCONUT OIL OIL
1.0000 "application " | TOPICAL_OIL | Status: DC | PRN
  Administered 2020-04-05: 1 via TOPICAL
  Filled 2020-04-05: qty 120

## 2020-04-05 MED ORDER — KETOROLAC TROMETHAMINE 30 MG/ML IJ SOLN: 30.0000 mg | Freq: Four times a day (QID) | INTRAMUSCULAR | Status: AC | PRN

## 2020-04-05 MED ORDER — KETOROLAC TROMETHAMINE 30 MG/ML IJ SOLN
INTRAMUSCULAR | Status: DC | PRN
  Administered 2020-04-05: 30 mg via INTRAVENOUS

## 2020-04-05 MED ORDER — ONDANSETRON HCL 4 MG/2ML IJ SOLN: 4.0000 mg | Freq: Once | INTRAMUSCULAR | Status: DC | PRN

## 2020-04-05 MED ORDER — BUPIVACAINE HCL (PF) 0.5 % IJ SOLN
30.0000 mL | INTRAMUSCULAR | Status: DC
  Filled 2020-04-05: qty 30

## 2020-04-05 MED ORDER — ACETAMINOPHEN 500 MG PO TABS
1000.0000 mg | ORAL_TABLET | Freq: Four times a day (QID) | ORAL | Status: DC
  Administered 2020-04-06 – 2020-04-07 (×4): 1000 mg via ORAL
  Filled 2020-04-05 (×4): qty 2

## 2020-04-05 MED ORDER — SODIUM CHLORIDE (PF) 0.9 % IJ SOLN
INTRAMUSCULAR | Status: AC
  Filled 2020-04-05: qty 50

## 2020-04-05 MED ORDER — DIPHENHYDRAMINE HCL 50 MG/ML IJ SOLN: 12.5000 mg | INTRAMUSCULAR | Status: DC | PRN

## 2020-04-05 MED ORDER — NALBUPHINE HCL 10 MG/ML IJ SOLN: 5.0000 mg | Freq: Once | INTRAMUSCULAR | Status: DC | PRN

## 2020-04-05 MED ORDER — METHYLENE BLUE 0.5 % INJ SOLN
INTRAVENOUS | Status: AC
  Filled 2020-04-05: qty 10

## 2020-04-05 MED ORDER — SODIUM CHLORIDE 0.9% FLUSH: 3.0000 mL | INTRAVENOUS | Status: DC | PRN

## 2020-04-05 MED ORDER — EPHEDRINE 5 MG/ML INJ
INTRAVENOUS | Status: AC
  Filled 2020-04-05: qty 10

## 2020-04-05 MED ORDER — FENTANYL CITRATE (PF) 100 MCG/2ML IJ SOLN
INTRAMUSCULAR | Status: DC | PRN
  Administered 2020-04-05 (×4): 25 ug via INTRAVENOUS
  Administered 2020-04-05: 10 ug via INTRAVENOUS
  Administered 2020-04-05: 25 ug via INTRAVENOUS
  Administered 2020-04-05: 15 ug via INTRAVENOUS
  Administered 2020-04-05: 50 ug via INTRAVENOUS

## 2020-04-05 MED ORDER — GABAPENTIN 300 MG PO CAPS
300.0000 mg | ORAL_CAPSULE | Freq: Every day | ORAL | Status: DC
  Administered 2020-04-05 – 2020-04-06 (×2): 300 mg via ORAL
  Filled 2020-04-05 (×2): qty 1

## 2020-04-05 MED ORDER — SOD CITRATE-CITRIC ACID 500-334 MG/5ML PO SOLN
30.0000 mL | ORAL | Status: AC
  Administered 2020-04-05: 30 mL via ORAL
  Filled 2020-04-05: qty 30

## 2020-04-05 MED ORDER — FENTANYL CITRATE (PF) 100 MCG/2ML IJ SOLN
25.0000 ug | INTRAMUSCULAR | Status: DC | PRN
  Administered 2020-04-05: 25 ug via INTRAVENOUS
  Filled 2020-04-05: qty 2

## 2020-04-05 MED ORDER — LACTATED RINGERS IV SOLN: INTRAVENOUS | Status: DC

## 2020-04-05 MED ORDER — IBUPROFEN 800 MG PO TABS
800.0000 mg | ORAL_TABLET | Freq: Four times a day (QID) | ORAL | Status: DC
  Administered 2020-04-06 – 2020-04-07 (×5): 800 mg via ORAL
  Filled 2020-04-05 (×5): qty 1

## 2020-04-05 MED ORDER — HYDROMORPHONE HCL 2 MG PO TABS
2.0000 mg | ORAL_TABLET | Freq: Four times a day (QID) | ORAL | Status: DC | PRN
  Administered 2020-04-06 – 2020-04-07 (×2): 2 mg via ORAL
  Filled 2020-04-05 (×2): qty 1

## 2020-04-05 MED ORDER — FENTANYL CITRATE (PF) 100 MCG/2ML IJ SOLN
INTRAMUSCULAR | Status: AC
  Filled 2020-04-05: qty 2

## 2020-04-05 MED ORDER — MEASLES, MUMPS & RUBELLA VAC IJ SOLR
0.5000 mL | Freq: Once | INTRAMUSCULAR | Status: DC
  Filled 2020-04-05 (×2): qty 0.5

## 2020-04-05 MED ORDER — DIPHENHYDRAMINE HCL 25 MG PO CAPS: 25.0000 mg | ORAL_CAPSULE | Freq: Four times a day (QID) | ORAL | Status: DC | PRN

## 2020-04-05 MED ORDER — BISACODYL 10 MG RE SUPP: 10.0000 mg | Freq: Every day | RECTAL | Status: DC | PRN

## 2020-04-05 MED ORDER — MENTHOL 3 MG MT LOZG
1.0000 | LOZENGE | OROMUCOSAL | Status: DC | PRN
  Filled 2020-04-05: qty 9

## 2020-04-05 MED ORDER — DIBUCAINE (PERIANAL) 1 % EX OINT: 1.0000 "application " | TOPICAL_OINTMENT | CUTANEOUS | Status: DC | PRN

## 2020-04-05 MED ORDER — KETOROLAC TROMETHAMINE 30 MG/ML IJ SOLN
INTRAMUSCULAR | Status: AC
  Filled 2020-04-05: qty 1

## 2020-04-05 MED ORDER — MORPHINE SULFATE (PF) 0.5 MG/ML IJ SOLN
INTRAMUSCULAR | Status: AC
  Filled 2020-04-05: qty 10

## 2020-04-05 MED ORDER — MEPERIDINE HCL 25 MG/ML IJ SOLN: 6.2500 mg | INTRAMUSCULAR | Status: DC | PRN

## 2020-04-05 MED ORDER — WITCH HAZEL-GLYCERIN EX PADS: 1.0000 "application " | MEDICATED_PAD | CUTANEOUS | Status: DC | PRN

## 2020-04-05 MED ORDER — SENNOSIDES-DOCUSATE SODIUM 8.6-50 MG PO TABS
2.0000 | ORAL_TABLET | ORAL | Status: DC
  Administered 2020-04-06 – 2020-04-07 (×2): 2 via ORAL
  Filled 2020-04-05 (×2): qty 2

## 2020-04-05 MED ORDER — PHENYLEPHRINE HCL-NACL 10-0.9 MG/250ML-% IV SOLN
INTRAVENOUS | Status: DC | PRN
  Administered 2020-04-05: 50 ug/min via INTRAVENOUS

## 2020-04-05 MED ORDER — BUPIVACAINE IN DEXTROSE 0.75-8.25 % IT SOLN: INTRATHECAL | Status: DC | PRN

## 2020-04-05 MED ORDER — PHENYLEPHRINE HCL (PRESSORS) 10 MG/ML IV SOLN: INTRAVENOUS | Status: DC | PRN

## 2020-04-05 MED ORDER — PRENATAL MULTIVITAMIN CH
1.0000 | ORAL_TABLET | Freq: Every day | ORAL | Status: DC
  Administered 2020-04-06 – 2020-04-07 (×2): 1 via ORAL
  Filled 2020-04-05 (×2): qty 1

## 2020-04-05 MED ORDER — BUPIVACAINE IN DEXTROSE 0.75-8.25 % IT SOLN
INTRATHECAL | Status: DC | PRN
  Administered 2020-04-05: 1.6 mL via INTRATHECAL

## 2020-04-05 MED ORDER — DEXAMETHASONE SODIUM PHOSPHATE 10 MG/ML IJ SOLN
INTRAMUSCULAR | Status: DC | PRN
  Administered 2020-04-05: 10 mg via INTRAVENOUS

## 2020-04-05 MED ORDER — SIMETHICONE 80 MG PO CHEW
80.0000 mg | CHEWABLE_TABLET | Freq: Three times a day (TID) | ORAL | Status: DC
  Administered 2020-04-05 – 2020-04-07 (×6): 80 mg via ORAL
  Filled 2020-04-05 (×5): qty 1

## 2020-04-05 MED ORDER — OXYTOCIN 40 UNITS IN NORMAL SALINE INFUSION - SIMPLE MED
INTRAVENOUS | Status: DC | PRN
  Administered 2020-04-05: 1000 mL via INTRAVENOUS

## 2020-04-05 MED ORDER — FLEET ENEMA 7-19 GM/118ML RE ENEM: 1.0000 | ENEMA | Freq: Every day | RECTAL | Status: DC | PRN

## 2020-04-05 MED ORDER — GENTAMICIN SULFATE 40 MG/ML IJ SOLN
340.0000 mg | INTRAVENOUS | Status: AC
  Administered 2020-04-05: 340 mg via INTRAVENOUS
  Filled 2020-04-05: qty 8.5

## 2020-04-05 MED ORDER — CYCLOBENZAPRINE HCL 10 MG PO TABS
5.0000 mg | ORAL_TABLET | Freq: Three times a day (TID) | ORAL | Status: DC | PRN
  Administered 2020-04-05 – 2020-04-07 (×6): 5 mg via ORAL
  Filled 2020-04-05 (×9): qty 0.5

## 2020-04-05 MED ORDER — NALBUPHINE HCL 10 MG/ML IJ SOLN: 5.0000 mg | INTRAMUSCULAR | Status: DC | PRN

## 2020-04-05 MED ORDER — HEMOSTATIC AGENTS (NO CHARGE) OPTIME
TOPICAL | Status: DC | PRN
  Administered 2020-04-05: 1 via TOPICAL

## 2020-04-05 MED ORDER — MORPHINE SULFATE (PF) 0.5 MG/ML IJ SOLN
INTRAMUSCULAR | Status: DC | PRN
  Administered 2020-04-05: 100 ug via INTRATHECAL

## 2020-04-05 MED ORDER — NALOXONE HCL 0.4 MG/ML IJ SOLN: 0.4000 mg | INTRAMUSCULAR | Status: DC | PRN

## 2020-04-05 MED ORDER — KETOROLAC TROMETHAMINE 30 MG/ML IJ SOLN
30.0000 mg | Freq: Four times a day (QID) | INTRAMUSCULAR | Status: AC
  Administered 2020-04-05 – 2020-04-06 (×4): 30 mg via INTRAVENOUS
  Filled 2020-04-05 (×4): qty 1

## 2020-04-05 MED ORDER — BUPIVACAINE LIPOSOME 1.3 % IJ SUSP
20.0000 mL | INTRAMUSCULAR | Status: DC
  Filled 2020-04-05: qty 20

## 2020-04-05 MED ORDER — OXYTOCIN 40 UNITS IN NORMAL SALINE INFUSION - SIMPLE MED: 2.5000 [IU]/h | INTRAVENOUS | Status: AC

## 2020-04-05 MED ORDER — OXYTOCIN 40 UNITS IN NORMAL SALINE INFUSION - SIMPLE MED
INTRAVENOUS | Status: AC
  Filled 2020-04-05: qty 1000

## 2020-04-05 MED ORDER — PANTOPRAZOLE SODIUM 40 MG PO TBEC
40.0000 mg | DELAYED_RELEASE_TABLET | Freq: Every day | ORAL | Status: DC
  Administered 2020-04-05 – 2020-04-06 (×2): 40 mg via ORAL
  Filled 2020-04-05 (×2): qty 1

## 2020-04-05 MED ORDER — ONDANSETRON HCL 4 MG/2ML IJ SOLN
INTRAMUSCULAR | Status: DC | PRN
  Administered 2020-04-05: 4 mg via INTRAVENOUS

## 2020-04-05 MED ORDER — SODIUM CHLORIDE FLUSH 0.9 % IV SOLN
INTRAVENOUS | Status: DC | PRN
  Administered 2020-04-05: 100 mL

## 2020-04-05 MED ORDER — FERROUS SULFATE 325 (65 FE) MG PO TABS
325.0000 mg | ORAL_TABLET | Freq: Two times a day (BID) | ORAL | Status: DC
  Administered 2020-04-05 – 2020-04-07 (×4): 325 mg via ORAL
  Filled 2020-04-05 (×4): qty 1

## 2020-04-05 MED ORDER — FLUTICASONE PROPIONATE 50 MCG/ACT NA SUSP
1.0000 | Freq: Every day | NASAL | Status: DC
  Administered 2020-04-07: 2 via NASAL
  Filled 2020-04-05: qty 16

## 2020-04-05 MED ORDER — LACTATED RINGERS IV SOLN
INTRAVENOUS | Status: DC | PRN
Start: 2020-04-05 — End: 2020-04-05

## 2020-04-05 MED ORDER — METHYLENE BLUE 0.5 % INJ SOLN
INTRAVENOUS | Status: DC | PRN
  Administered 2020-04-05: 10 mL

## 2020-04-05 MED ORDER — CLINDAMYCIN PHOSPHATE 900 MG/50ML IV SOLN
900.0000 mg | INTRAVENOUS | Status: AC
  Administered 2020-04-05: 900 mg via INTRAVENOUS
  Filled 2020-04-05: qty 50

## 2020-04-05 MED ORDER — PHENYLEPHRINE HCL-NACL 20-0.9 MG/250ML-% IV SOLN
INTRAVENOUS | Status: DC | PRN
  Administered 2020-04-05: 50 ug/min via INTRAVENOUS

## 2020-04-05 MED ORDER — NALOXONE HCL 4 MG/10ML IJ SOLN
1.0000 ug/kg/h | INTRAVENOUS | Status: DC | PRN
  Filled 2020-04-05: qty 5

## 2020-04-05 MED ORDER — SIMETHICONE 80 MG PO CHEW
80.0000 mg | CHEWABLE_TABLET | ORAL | Status: DC
  Administered 2020-04-06 – 2020-04-07 (×2): 80 mg via ORAL
  Filled 2020-04-05 (×2): qty 1

## 2020-04-05 MED ORDER — SIMETHICONE 80 MG PO CHEW
80.0000 mg | CHEWABLE_TABLET | ORAL | Status: DC | PRN
  Filled 2020-04-05: qty 1

## 2020-04-05 MED ORDER — PHENYLEPHRINE HCL (PRESSORS) 10 MG/ML IV SOLN
INTRAVENOUS | Status: AC
  Filled 2020-04-05: qty 1

## 2020-04-05 MED ORDER — ACETAMINOPHEN 500 MG PO TABS
1000.0000 mg | ORAL_TABLET | Freq: Four times a day (QID) | ORAL | Status: AC
  Administered 2020-04-05 – 2020-04-06 (×4): 1000 mg via ORAL
  Filled 2020-04-05 (×5): qty 2

## 2020-04-05 MED ORDER — DIPHENHYDRAMINE HCL 25 MG PO CAPS: 25.0000 mg | ORAL_CAPSULE | ORAL | Status: DC | PRN

## 2020-04-05 SURGICAL SUPPLY — 39 items
BARRIER ADHS 3X4 INTERCEED (GAUZE/BANDAGES/DRESSINGS) ×2 IMPLANT
CANISTER SUCT 3000ML PPV (MISCELLANEOUS) ×3 IMPLANT
CLOSURE WOUND 1/2 X4 (GAUZE/BANDAGES/DRESSINGS) ×1
COVER WAND RF STERILE (DRAPES) ×3 IMPLANT
DERMABOND ADVANCED (GAUZE/BANDAGES/DRESSINGS) ×2
DERMABOND ADVANCED .7 DNX12 (GAUZE/BANDAGES/DRESSINGS) ×1 IMPLANT
DRSG TELFA 3X8 NADH (GAUZE/BANDAGES/DRESSINGS) ×3 IMPLANT
ELECT CAUTERY BLADE 6.4 (BLADE) ×3 IMPLANT
ELECT REM PT RETURN 9FT ADLT (ELECTROSURGICAL) ×3
ELECTRODE REM PT RTRN 9FT ADLT (ELECTROSURGICAL) ×1 IMPLANT
GAUZE SPONGE 4X4 12PLY STRL (GAUZE/BANDAGES/DRESSINGS) ×3 IMPLANT
GLOVE PI ORTHOPRO 6.5 (GLOVE) ×2
GLOVE PI ORTHOPRO STRL 6.5 (GLOVE) ×1 IMPLANT
GLOVE SURG SYN 6.5 ES PF (GLOVE) ×3 IMPLANT
GLOVE SURG SYN 6.5 PF PI (GLOVE) ×1 IMPLANT
GOWN STRL REUS W/ TWL LRG LVL3 (GOWN DISPOSABLE) ×3 IMPLANT
GOWN STRL REUS W/TWL LRG LVL3 (GOWN DISPOSABLE) ×9
NS IRRIG 1000ML POUR BTL (IV SOLUTION) ×3 IMPLANT
PACK C SECTION (MISCELLANEOUS) ×3 IMPLANT
PAD DRESSING TELFA 3X8 NADH (GAUZE/BANDAGES/DRESSINGS) ×1 IMPLANT
PAD OB MATERNITY 4.3X12.25 (PERSONAL CARE ITEMS) ×3 IMPLANT
PAD PREP 24X41 OB/GYN DISP (PERSONAL CARE ITEMS) ×3 IMPLANT
PENCIL SMOKE EVACUATOR (MISCELLANEOUS) ×2 IMPLANT
PENCIL SMOKE ULTRAEVAC 22 CON (MISCELLANEOUS) ×3 IMPLANT
SLEEVE PROTECTION STRL DISP (MISCELLANEOUS) ×2 IMPLANT
SPONGE LAP 18X18 RF (DISPOSABLE) ×2 IMPLANT
STRAP SAFETY 5IN WIDE (MISCELLANEOUS) ×3 IMPLANT
STRIP CLOSURE SKIN 1/2X4 (GAUZE/BANDAGES/DRESSINGS) ×2 IMPLANT
SUT MNCRL 4-0 (SUTURE) ×3
SUT MNCRL 4-0 27XMFL (SUTURE) ×1
SUT PDS AB 1 TP1 96 (SUTURE) ×3 IMPLANT
SUT VIC AB 0 CT1 36 (SUTURE) ×8 IMPLANT
SUT VIC AB 2-0 CT1 27 (SUTURE) ×3
SUT VIC AB 2-0 CT1 TAPERPNT 27 (SUTURE) ×1 IMPLANT
SUT VIC AB 3-0 SH 27 (SUTURE) ×3
SUT VIC AB 3-0 SH 27X BRD (SUTURE) ×1 IMPLANT
SUTURE MNCRL 4-0 27XMF (SUTURE) ×1 IMPLANT
SWABSTK COMLB BENZOIN TINCTURE (MISCELLANEOUS) ×3 IMPLANT
TIPS TEFLON (MISCELLANEOUS) ×2 IMPLANT

## 2020-04-05 NOTE — Op Note (Signed)
  Cesarean Section Procedure Note  Date of procedure: 04/05/2020   Pre-operative Diagnosis: Intrauterine pregnancy at [redacted]w[redacted]d; breech presentation; prior LTCS  Post-operative Diagnosis: same, delivered.  Procedure: Repeat Low Transverse Cesarean Section through Pfannenstiel incision  Surgeon: Christeen Douglas, MD  Assistant(s):  Haroldine Laws, CNM Student: Docia Chuck, PA-S  Anesthesia: Spinal anesthesia  Estimated Blood Loss:  300         Total IV Fluids: 2Lml  Urine Output:         Specimens: None         Complications:  None; patient tolerated the procedure well.         Disposition: PACU - hemodynamically stable.         Condition: stable  Findings:  A female infant "[name]" in frank breech presentation. Amniotic fluid - Clear  Birth weight 2650 g.  Apgars of 8 and 8 at one and five minutes respectively.  Intact placenta with a three-vessel cord.  Grossly normal uterus, tubes and ovaries bilaterally. Moderate intraabdominal adhesions were noted, most notably between the bladder and the LUS - the bladder was back filled to assure no damage and it was intact and well away from the incision. Oozing at the hysterotomy required a full second layer of imbrication plus multiple figure-of-eights.   Indications: frank breech presentation; prior LTCS  Procedure Details  The patient was taken to Operating Room, identified as the correct patient and the procedure verified as C-Section Delivery. A formal Time Out was held with all team members present and in agreement.  After induction of spinal anesthesia, the patient was draped and prepped in the usual sterile manner. A Pfannenstiel skin incision was made and carried down through the subcutaneous tissue to the fascia. Fascial incision was made and extended transversely with the Mayo scissors. The fascia was separated from the underlying rectus tissue superiorly and inferiorly. The peritoneum was identified and entered bluntly.  Peritoneal incision was extended longitudinally. The utero-vesical peritoneal reflection was incised transversely and a bladder flap was created digitally.   A low transverse hysterotomy was made. The fetus was delivered in a breech presentation atraumatically, using standard breech manuvers. The umbilical cord was clamped x2 and cut and the infant was handed to the awaiting pediatricians. The placenta was removed intact and appeared normal, intact, and with a 3-vessel cord.   The uterus was exteriorized and cleared of all clot and debris. The hysterotomy was closed with running sutures of 0-Vicryl. A second imbricating layer was placed with the same suture. Multiple figure of eight sutures placed for hemostasis, and electrocautery used sparingly along the peritoneal edge.  Excellent hemostasis was observed. The peritoneal cavity was cleared of all clots and debris. The uterus was returned to the abdomen.   The fascia was then reapproximated with running sutures of 0 Vicryl. The skin was reapproximated with a 4-0 Monocryl subcuticular stitch.  Exparel instilled along the fascial and skin lines in standard fashion for control of postop pain.  Instrument, sponge, and needle counts were correct prior to the abdominal closure and at the conclusion of the case.   The patient tolerated the procedure well and was transferred to the recovery room in stable condition.   Christeen Douglas, MD 04/05/2020

## 2020-04-05 NOTE — Anesthesia Procedure Notes (Signed)
Spinal  Patient location during procedure: OR Start time: 04/05/2020 8:02 AM End time: 04/05/2020 8:04 AM Staffing Performed: resident/CRNA  Resident/CRNA: Justus Memory, CRNA Preanesthetic Checklist Completed: patient identified, IV checked, site marked, risks and benefits discussed, surgical consent, monitors and equipment checked, pre-op evaluation and timeout performed Spinal Block Patient position: sitting Prep: Betadine Patient monitoring: heart rate, continuous pulse ox, blood pressure and cardiac monitor Approach: midline Location: L3-4 Injection technique: single-shot Needle Needle type: Introducer and Pencan  Needle gauge: 24 G Needle length: 9 cm Assessment Sensory level: T4 Additional Notes Negative paresthesia. Negative blood return. Positive free-flowing CSF. Expiration date of kit checked and confirmed. Patient tolerated procedure well, without complications.

## 2020-04-05 NOTE — OB Triage Note (Signed)
Pt here for scheduled c/s. No complaints this morning. Feeling baby move well.

## 2020-04-05 NOTE — Interval H&P Note (Signed)
History and Physical Interval Note:  04/05/2020 7:35 AM  Michele Hicks  has presented today for surgery, with the diagnosis of term pregnancy, prior cesarean.  The various methods of treatment have been discussed with the patient and family. After consideration of risks, benefits and other options for treatment, the patient has consented to  Procedure(s): REPEAT CESAREAN SECTION (N/A) as a surgical intervention.  The patient's history has been reviewed, patient examined, no change in status, stable for surgery.  I have reviewed the patient's chart and labs.  Questions were answered to the patient's satisfaction.     Christeen Douglas

## 2020-04-05 NOTE — Plan of Care (Signed)
Patient had uncomplicated cesarean delivery and recovery is going smoothly so far. Bleeding WDL

## 2020-04-05 NOTE — Transfer of Care (Signed)
Immediate Anesthesia Transfer of Care Note  Patient: Michele Hicks  Procedure(s) Performed: REPEAT CESAREAN SECTION (N/A ) BLADDER INSTILLATION (N/A )  Patient Location: PACU  Anesthesia Type:Spinal  Level of Consciousness: awake, alert  and oriented  Airway & Oxygen Therapy: Patient Spontanous Breathing  Post-op Assessment: Report given to RN and Post -op Vital signs reviewed and stable  Post vital signs: Reviewed and stable  Last Vitals:  Vitals Value Taken Time  BP    Temp    Pulse    Resp    SpO2      Last Pain:  Vitals:   04/05/20 0609  TempSrc: Oral  PainSc: 0-No pain         Complications: No apparent anesthesia complications

## 2020-04-05 NOTE — Anesthesia Preprocedure Evaluation (Addendum)
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, no meds, no other seizures),     GI/Hepatic Neg liver ROS, GERD (with pregnancy)  Medicated,  Endo/Other  neg diabetes  Renal/GU negative Renal ROS     Musculoskeletal   Abdominal   Peds  Hematology   Anesthesia Other Findings   Reproductive/Obstetrics                            Anesthesia Physical Anesthesia Plan  ASA: II  Anesthesia Plan: Spinal   Post-op Pain Management:    Induction:   PONV Risk Score and Plan:   Airway Management Planned:   Additional Equipment:   Intra-op Plan:   Post-operative Plan:   Informed Consent: I have reviewed the patients History and Physical, chart, labs and discussed the procedure including the risks, benefits and alternatives for the proposed anesthesia with the patient or authorized representative who has indicated his/her understanding and acceptance.       Plan Discussed with:   Anesthesia Plan Comments:         Anesthesia Quick Evaluation

## 2020-04-05 NOTE — Progress Notes (Signed)
Time out performed at 0819 but was charted in the intra-op OR navigator tab as the same time as pre procedural time out of 0755. Put in addendum of time out being performed at 0819 in intra op tab.

## 2020-04-06 LAB — CBC
HCT: 27 % — ABNORMAL LOW (ref 36.0–46.0)
Hemoglobin: 9.4 g/dL — ABNORMAL LOW (ref 12.0–15.0)
MCH: 31.6 pg (ref 26.0–34.0)
MCHC: 34.8 g/dL (ref 30.0–36.0)
MCV: 90.9 fL (ref 80.0–100.0)
Platelets: 211 10*3/uL (ref 150–400)
RBC: 2.97 MIL/uL — ABNORMAL LOW (ref 3.87–5.11)
RDW: 13.6 % (ref 11.5–15.5)
WBC: 17.4 10*3/uL — ABNORMAL HIGH (ref 4.0–10.5)
nRBC: 0 % (ref 0.0–0.2)

## 2020-04-06 MED ORDER — LACTATED RINGERS IV BOLUS
500.0000 mL | Freq: Once | INTRAVENOUS | Status: AC
  Administered 2020-04-06: 500 mL via INTRAVENOUS

## 2020-04-06 NOTE — Lactation Note (Signed)
This note was copied from a baby's chart. Lactation Consultation Note  Patient Name: Michele Hicks Today's Date: 04/06/2020 Reason for consult: Initial assessment;Term  LC in for initial visit. Mom is G2P2. Baby girl "[name]" born via C/S due to breech presentation. Baby is now 30 HOL. Mother had prior 18 months experience breastfeeding her older child. Had challenges with tongue-tie revision and a dip in her supply when pumping began.  Upon entry to room, mother is latching baby in modified laid back position due to neck discomfort mother is experiencing. Mother able to confidently assist baby with latching on the left side. Baby's lips are flanged and able to open wide for deep latch. Swallows noted. Mother denies any discomfort, pain or pinching with latch. Baby still feeding at the end of visit.  Mother shares that baby fed all night and decision was made to supplement with syringe and formula in order to achieve some rest. Briefly discussed potential for baby to cluster feed this evening and to keep following baby's cues. Encouraged mother to also continue with hand expressing during these early stages of breastfeeding. Mother expresses that she does know how to hand express and will continue with this practice.  Instructed on how to contact [facility] while inpatient if questions/concerns arise and reassured mother that [facility] would follow-up with support this afternoon or tomorrow.    Maternal Data Formula Feeding for Exclusion: No Has patient been taught Hand Expression?: Yes Does the patient have breastfeeding experience prior to this delivery?: Yes  Feeding Feeding Type: Breast Fed  LATCH Score Latch: Grasps breast easily, tongue down, lips flanged, rhythmical sucking.  Audible Swallowing: A few with stimulation  Type of Nipple: Everted at rest and after stimulation  Comfort (Breast/Nipple): Soft / non-tender  Hold (Positioning): No assistance needed to correctly position infant at  breast.  LATCH Score: 9  Interventions Interventions: Breast feeding basics reviewed;Skin to skin;Hand express;Expressed milk  Lactation Tools Discussed/Used     Consult Status Consult Status: Follow-up Date: 04/06/20 Follow-up type: In-patient    Corlis Hove [facility] Student 04/06/2020, 10:55 AM

## 2020-04-06 NOTE — Lactation Note (Signed)
This note was copied from a baby's chart. Lactation Consultation Note  Patient Name: Michele Hicks Today's Date: 04/06/2020   [facility] student in for follow-up visit. Mother reports that baby has been resting since finishing last breastfeed @ 11am. States she would like to wait to wake her up in a little bit. Encouraged skin-to-skin and hand expression with this next feeding attempt.  Reiterated the potential for baby to cluster feed over night. Mother denies any questions or concerns at this time. Encouraged to call [facility] with next feed if assistance needed. LC to follow-up tomorrow.   Maternal Data    Feeding    LATCH Score                   Interventions    Lactation Tools Discussed/Used     Consult Status      Corlis Hove -LC Student 04/06/2020, 3:18 PM

## 2020-04-06 NOTE — Progress Notes (Signed)
Notified CNM Tobi Bastos notified of low BP and low orthostatic BP. CNM Tobi Bastos stated at this time anesthesiology notification isn't warranted. New orders given. Will continue with plan of care.

## 2020-04-06 NOTE — Progress Notes (Signed)
Post Partum Day 1 Subjective: Doing well, no complaints.  Tolerating regular diet, pain with PO meds, voiding and ambulating without difficulty.  - Noted mod amount new drainage on incision dressing after up to bathroom with RN.   No CP SOB Fever,Chills, N/V or leg pain; denies nipple or breast pain, no HA change of vision, RUQ/epigastric pain  Objective: BP (!) 84/61 (BP Location: Left Arm)   Pulse 61   Temp 98.5 F (36.9 C) (Oral)   Resp 20   Ht 5\' 3"  (1.6 m)   Wt 67.1 kg   LMP 07/06/2019   SpO2 100%   Breastfeeding Unknown   BMI 26.22 kg/m    Physical Exam:  General: NAD Breasts: soft/nontender CV: RRR Pulm: nl effort, CTABL Abdomen: soft, NT, BS x 4 Incision: paper tape dressing with 4x4s noted to be >50% saturated with dark red bleeding. Dressing removed and noted that dermabond medical glue had adhered to 4x4 gauze and peeled up from incision, noted slow oozing of dark red bleeding from incision site at several areas at central incision. No dehiscence. Pressure applied and bleeding ceased but then restarted again after a few minutes. Pressure dressing applied with telfa x 2, then doubled 4x4 gauze and elastic tape.  Lochia: scant Uterine Fundus: fundus firm and 26fb below umbilicus DVT Evaluation: no cords, ttp LEs   Recent Labs    04/05/20 0551 04/06/20 0442  HGB 11.9* 9.4*  HCT 34.4* 27.0*  WBC 9.5 17.4*  PLT 257 211    Assessment/Plan: 39 y.o. G2P2002 postpartum day # 1  - Continue routine PP care, encouraged ambulation, regular diet and prn pain medication as needed.  - Lactation consult prn - Pressure dressing applied due to incision drainage, will continue to monitor, Dr 24 updated.  - Acute blood loss anemia - hemodynamically stable and asymptomatic; continue po ferrous sulfate BID with stool softeners  - elevated WBC- afebrile, no sx infection, will repeat CBC in am.  - Immunization status: all Imms up to date- allergy to TDap and declined  flu    Disposition: Does not desire Dc home today.     Feliberto Gottron, CNM 04/06/2020  9:44 AM

## 2020-04-06 NOTE — Anesthesia Postprocedure Evaluation (Signed)
Anesthesia Post Note  Patient: Aylee C Gertner  Procedure(s) Performed: REPEAT CESAREAN SECTION (N/A ) BLADDER INSTILLATION (N/A )  Patient location during evaluation: Mother Baby Anesthesia Type: Spinal Level of consciousness: awake, awake and alert, oriented and patient cooperative Pain management: pain level controlled Vital Signs Assessment: post-procedure vital signs reviewed and stable Respiratory status: spontaneous breathing, nonlabored ventilation and respiratory function stable Cardiovascular status: stable Postop Assessment: no headache, no backache, patient able to bend at knees, able to ambulate, adequate PO intake and no apparent nausea or vomiting Anesthetic complications: no     Last Vitals:  Vitals:   04/06/20 0417 04/06/20 0726  BP: (!) 78/48 (!) 84/61  Pulse: (!) 58 61  Resp: 20 20  Temp: 37.1 C 36.9 C  SpO2: 97% 100%    Last Pain:  Vitals:   04/06/20 0726  TempSrc: Oral  PainSc:                  Lyn Records

## 2020-04-07 LAB — CBC WITH DIFFERENTIAL/PLATELET
Abs Immature Granulocytes: 0.11 10*3/uL — ABNORMAL HIGH (ref 0.00–0.07)
Basophils Absolute: 0.1 10*3/uL (ref 0.0–0.1)
Basophils Relative: 1 %
Eosinophils Absolute: 0.1 10*3/uL (ref 0.0–0.5)
Eosinophils Relative: 1 %
HCT: 28.5 % — ABNORMAL LOW (ref 36.0–46.0)
Hemoglobin: 9.4 g/dL — ABNORMAL LOW (ref 12.0–15.0)
Immature Granulocytes: 1 %
Lymphocytes Relative: 31 %
Lymphs Abs: 3.1 10*3/uL (ref 0.7–4.0)
MCH: 30.8 pg (ref 26.0–34.0)
MCHC: 33 g/dL (ref 30.0–36.0)
MCV: 93.4 fL (ref 80.0–100.0)
Monocytes Absolute: 0.8 10*3/uL (ref 0.1–1.0)
Monocytes Relative: 8 %
Neutro Abs: 5.7 10*3/uL (ref 1.7–7.7)
Neutrophils Relative %: 58 %
Platelets: 213 10*3/uL (ref 150–400)
RBC: 3.05 MIL/uL — ABNORMAL LOW (ref 3.87–5.11)
RDW: 14 % (ref 11.5–15.5)
WBC: 9.8 10*3/uL (ref 4.0–10.5)
nRBC: 0 % (ref 0.0–0.2)

## 2020-04-07 MED ORDER — ACETAMINOPHEN 500 MG PO TABS: 1000.0000 mg | ORAL_TABLET | Freq: Four times a day (QID) | ORAL | Status: AC | PRN

## 2020-04-07 MED ORDER — HYDROMORPHONE HCL 2 MG PO TABS: 2.0000 mg | ORAL_TABLET | Freq: Four times a day (QID) | ORAL | 0 refills | Status: AC | PRN

## 2020-04-07 MED ORDER — CYCLOBENZAPRINE HCL 5 MG PO TABS: 5.0000 mg | ORAL_TABLET | Freq: Three times a day (TID) | ORAL | 0 refills | Status: AC | PRN

## 2020-04-07 MED ORDER — IBUPROFEN 600 MG PO TABS: 600.0000 mg | ORAL_TABLET | Freq: Four times a day (QID) | ORAL | 0 refills | Status: AC | PRN

## 2020-04-07 NOTE — Discharge Instructions (Signed)
Cesarean Delivery, Care After This sheet gives you information about how to care for yourself after your procedure. Your health care provider may also give you more specific instructions. If you have problems or questions, contact your health care provider. What can I expect after the procedure? After the procedure, it is common to have:  A small amount of blood or clear fluid coming from the incision.  Some redness, swelling, and pain in your incision area.  Some abdominal pain and soreness.  Vaginal bleeding (lochia). Even though you did not have a vaginal delivery, you will still have vaginal bleeding and discharge.  Pelvic cramps.  Fatigue. You may have pain, swelling, and discomfort in the tissue between your vagina and your anus (perineum) if:  Your C-section was unplanned, and you were allowed to labor and push.  An incision was made in the area (episiotomy) or the tissue tore during attempted vaginal delivery. Follow these instructions at home: Incision care   Follow instructions from your health care provider about how to take care of your incision. Make sure you: ? Wash your hands with soap and water before you change your bandage (dressing). If soap and water are not available, use hand sanitizer. ? If you have a dressing, change it or remove it as told by your health care provider. ? Leave stitches (sutures), skin staples, skin glue, or adhesive strips in place. These skin closures may need to stay in place for 2 weeks or longer. If adhesive strip edges start to loosen and curl up, you may trim the loose edges. Do not remove adhesive strips completely unless your health care provider tells you to do that.  Check your incision area every day for signs of infection. Check for: ? More redness, swelling, or pain. ? More fluid or blood. ? Warmth. ? Pus or a bad smell.  Do not take baths, swim, or use a hot tub until your health care provider says it's okay. Ask your health  care provider if you can take showers.  When you cough or sneeze, hug a pillow. This helps with pain and decreases the chance of your incision opening up (dehiscing). Do this until your incision heals. Medicines  Take over-the-counter and prescription medicines only as told by your health care provider.  If you were prescribed an antibiotic medicine, take it as told by your health care provider. Do not stop taking the antibiotic even if you start to feel better.  Do not drive or use heavy machinery while taking prescription pain medicine. Lifestyle  Do not drink alcohol. This is especially important if you are breastfeeding or taking pain medicine.  Do not use any products that contain nicotine or tobacco, such as cigarettes, e-cigarettes, and chewing tobacco. If you need help quitting, ask your health care provider. Eating and drinking  Drink at least 8 eight-ounce glasses of water every day unless told not to by your health care provider. If you breastfeed, you may need to drink even more water.  Eat high-fiber foods every day. These foods may help prevent or relieve constipation. High-fiber foods include: ? Whole grain cereals and breads. ? Brown rice. ? Beans. ? Fresh fruits and vegetables. Activity   If possible, have someone help you care for your baby and help with household activities for at least a few days after you leave the hospital.  Return to your normal activities as told by your health care provider. Ask your health care provider what activities are safe for   you.  Rest as much as possible. Try to rest or take a nap while your baby is sleeping.  Do not lift anything that is heavier than 10 lbs (4.5 kg), or the limit that you were told, until your health care provider says that it is safe.  Talk with your health care provider about when you can engage in sexual activity. This may depend on your: ? Risk of infection. ? How fast you heal. ? Comfort and desire to  engage in sexual activity. General instructions  Do not use tampons or douches until your health care provider approves.  Wear loose, comfortable clothing and a supportive and well-fitting bra.  Keep your perineum clean and dry. Wipe from front to back when you use the toilet.  If you pass a blood clot, save it and call your health care provider to discuss. Do not flush blood clots down the toilet before you get instructions from your health care provider.  Keep all follow-up visits for you and your baby as told by your health care provider. This is important. Contact a health care provider if:  You have: ? A fever. ? Bad-smelling vaginal discharge. ? Pus or a bad smell coming from your incision. ? Difficulty or pain when urinating. ? A sudden increase or decrease in the frequency of your bowel movements. ? More redness, swelling, or pain around your incision. ? More fluid or blood coming from your incision. ? A rash. ? Nausea. ? Little or no interest in activities you used to enjoy. ? Questions about caring for yourself or your baby.  Your incision feels warm to the touch.  Your breasts turn red or become painful or hard.  You feel unusually sad or worried.  You vomit.  You pass a blood clot from your vagina.  You urinate more than usual.  You are dizzy or light-headed. Get help right away if:  You have: ? Pain that does not go away or get better with medicine. ? Chest pain. ? Difficulty breathing. ? Blurred vision or spots in your vision. ? Thoughts about hurting yourself or your baby. ? New pain in your abdomen or in one of your legs. ? A severe headache.  You faint.  You bleed from your vagina so much that you fill more than one sanitary pad in one hour. Bleeding should not be heavier than your heaviest period. Summary  After the procedure, it is common to have pain at your incision site, abdominal cramping, and slight bleeding from your vagina.  Check  your incision area every day for signs of infection.  Tell your health care provider about any unusual symptoms.  Keep all follow-up visits for you and your baby as told by your health care provider. This information is not intended to replace advice given to you by your health care provider. Make sure you discuss any questions you have with your health care provider. Document Revised: 05/29/2018 Document Reviewed: 05/29/2018 Elsevier Patient Education  2020 [employer].    Postpartum Baby Blues The postpartum period begins right after the birth of a baby. During this time, there is often a lot of joy and excitement. It is also a time of many changes in the life of the parents. No matter how many times a mother gives birth, each child brings new challenges to the family, including different ways of relating to one another. It is common to have feelings of excitement along with confusing changes in moods, emotions, and thoughts.  You may feel happy one minute and sad or stressed the next. These feelings of sadness usually happen in the period right after you have your baby, and they go away within a week or two. This is called the "baby blues." What are the causes? There is no known cause of baby blues. It is likely caused by a combination of factors. However, changes in hormone levels after childbirth are believed to trigger some of the symptoms. Other factors that can play a role in these mood changes include:  Lack of sleep.  Stressful life events, such as poverty, caring for a loved one, or death of a loved one.  Genetics. What are the signs or symptoms? Symptoms of this condition include:  Brief changes in mood, such as going from extreme happiness to sadness.  Decreased concentration.  Difficulty sleeping.  Crying spells and tearfulness.  Loss of appetite.  Irritability.  Anxiety. If the symptoms of baby blues last for more than 2 weeks or become more severe, you may have  postpartum depression. How is this diagnosed? This condition is diagnosed based on an evaluation of your symptoms. There are no medical or lab tests that lead to a diagnosis, but there are various questionnaires that a health care provider may use to identify women with the baby blues or postpartum depression. How is this treated? Treatment is not needed for this condition. The baby blues usually go away on their own in 1-2 weeks. Social support is often all that is needed. You will be encouraged to get adequate sleep and rest. Follow these instructions at home: Lifestyle      Get as much rest as you can. Take a nap when the baby sleeps.  Exercise regularly as told by your health care provider. Some women find yoga and walking to be helpful.  Eat a balanced and nourishing diet. This includes plenty of fruits and vegetables, whole grains, and lean proteins.  Do little things that you enjoy. Have a cup of tea, take a bubble bath, read your favorite magazine, or listen to your favorite music.  Avoid alcohol.  Ask for help with household chores, cooking, grocery shopping, or running errands. Do not try to do everything yourself. Consider hiring a postpartum doula to help. This is a professional who specializes in providing support to new mothers.  Try not to make any major life changes during pregnancy or right after giving birth. This can add stress. General instructions  Talk to people close to you about how you are feeling. Get support from your partner, family members, friends, or other new moms. You may want to join a support group.  Find ways to cope with stress. This may include: ? Writing your thoughts and feelings in a journal. ? Spending time outside. ? Spending time with people who make you laugh.  Try to stay positive in how you think. Think about the things you are grateful for.  Take over-the-counter and prescription medicines only as told by your health care  provider.  Let your health care provider know if you have any concerns.  Keep all postpartum visits as told by your health care provider. This is important. Contact a health care provider if:  Your baby blues do not go away after 2 weeks. Get help right away if:  You have thoughts of taking your own life (suicidal thoughts).  You think you may harm the baby or other people.  You see or hear things that are not there (hallucinations).  Summary  After giving birth, you may feel happy one minute and sad or stressed the next. Feelings of sadness that happen right after the baby is born and go away after a week or two are called the "baby blues."  You can manage the baby blues by getting enough rest, eating a healthy diet, exercising, spending time with supportive people, and finding ways to cope with stress.  If feelings of sadness and stress last longer than 2 weeks or get in the way of caring for your baby, talk to your health care provider. This may mean you have postpartum depression. This information is not intended to replace advice given to you by your health care provider. Make sure you discuss any questions you have with your health care provider. Document Revised: 03/14/2019 Document Reviewed: 01/16/2017 Elsevier Patient Education  2020 [employer].

## 2020-04-07 NOTE — Progress Notes (Signed)
DC inst reviewed with pt.  Verb u/o. Questions answered

## 2020-04-07 NOTE — Progress Notes (Signed)
Nurse called into room at 0315 after pt went to the restroom. Pt stated she felt like her incision had drainage. Nurse checked under pressure dressing and nonadherent and gauze were saturated with blood.   Gathered supplies and got another nurse to check incision. Incision was not continuously bleeding, even when pressure applied around it. There is a 2cm section that is more open than the remainder of the incision.   At 0330, redressed incision with steri-strips at 2cm section and then applied gauze, nonadherent dressing, and pressure dressing. Will continue to recheck site and notify physician if drainage continues/gets worse.

## 2020-04-07 NOTE — Discharge Summary (Signed)
Obstetrical Discharge Summary  Patient Name: Michele Hicks DOB: September 04, 1981 MRN: [id number]  Date of Admission: 04/05/2020 Date of Delivery: 04/05/20 Delivered by: Dr. Dalbert Garnet Date of Discharge: 04/07/2020  Primary OB: [facility]  [id number] last menstrual period was 07/06/2019. EDC Estimated Date of Delivery: 04/11/20 Gestational Age at Delivery: [redacted]w[redacted]d   Antepartum complications:  1. S<D - has small babies.  No lagging growth, consistently <20% 2. History of cesarean for breech 3. Codeine and oxycodone reactions - use morphine for narcotic postop. Admitting Diagnosis: Scheduled Repeat C/S Secondary Diagnosis: Patient Active Problem List   Diagnosis Date Noted  . Cesarean delivery delivered 04/07/2020  . Advanced maternal age, primigravida in first trimester, antepartum     Augmentation: n/a Complications: None  Intrapartum complications/course:  Delivery Type: repeat cesarean section, low transverse incision Anesthesia: spinal Placenta: spontaneous Laceration: none Episiotomy: none Newborn Data: Live born female  Birth Weight: 5 lb 13.5 oz (2650 g) APGAR: 8, 8  Newborn Delivery   Birth date/time: 04/05/2020 08:32:00 Delivery type: C-Section, Low Transverse Trial of labor: No C-section categorization: Repeat      Postpartum Procedures: Dressing changed twice d/t serosanguinous drainage. Pressure dressings and steri strips applied. No drainage noted on day of discharge.  Post partum course:  Patient had an uncomplicated postpartum course.  By time of discharge on POD#2, her pain was controlled on oral pain medications; she had appropriate lochia and was ambulating, voiding without difficulty, tolerating regular diet and passing flatus.   She was deemed stable for discharge to home.    Discharge Physical Exam:  BP (!) 82/51   Pulse 63   Temp 98.5 F (36.9 C) (Oral)   Resp 18   Ht 5\' 3"  (1.6 m)   Wt 67.1 kg   LMP 07/06/2019   SpO2 99%   Breastfeeding  Unknown   BMI 26.22 kg/m   General: alert and no distress Pulm: normal respiratory effort Lochia: appropriate Abdomen: soft, NT Uterine Fundus: firm, below umbilicus Incision: c/d/i, healing well, no significant drainage, no dehiscence, no significant erythema Extremities: No evidence of DVT seen on physical exam. No lower extremity edema. Edinburgh:  Edinburgh Postnatal Depression Scale Screening Tool 04/07/2020 04/06/2020 04/05/2020 04/05/2020  I have been able to laugh and see the funny side of things. 0 (No Data) (No Data) (No Data)  I have looked forward with enjoyment to things. 0 - - -  I have blamed myself unnecessarily when things went wrong. 0 - - -  I have been anxious or worried for no good reason. 0 - - -  I have felt scared or panicky for no good reason. 0 - - -  Things have been getting on top of me. 1 - - -  I have been so unhappy that I have had difficulty sleeping. 0 - - -  I have felt sad or miserable. 0 - - -  I have been so unhappy that I have been crying. 0 - - -  The thought of harming myself has occurred to me. 0 - - -  Edinburgh Postnatal Depression Scale Total 1 - - -     Labs: CBC Latest Ref Rng & Units 04/07/2020 04/06/2020 04/05/2020  WBC 4.0 - 10.5 K/uL 9.8 17.4(H) 9.5  Hemoglobin 12.0 - 15.0 g/dL 06/05/2020) 7.3(V) 11.9(L)  Hematocrit 36.0 - 46.0 % 28.5(L) 27.0(L) 34.4(L)  Platelets 150 - 400 K/uL 213 211 257   AB POS Hemoglobin  Date Value Ref Range Status  04/07/2020 9.4 (L)  12.0 - 15.0 g/dL Final   HCT  Date Value Ref Range Status  04/07/2020 28.5 (L) 36.0 - 46.0 % Final    Disposition: stable, discharge to home Baby Feeding: breastmilk Baby Disposition: home with mom  Contraception: TBD  Prenatal Labs:  Blood type/Rh AB positive  Antibody screen neg  Rubella Immune  Varicella Immune  RPR NR  HBsAg Neg  HIV NR  GC neg  Chlamydia neg  Genetic screening Negative NIPT, XX  1 hour GTT 85  3 hour GTT --  GBS negative    Rh Immune globulin  given: n/a Rubella vaccine given: n/a Varicella vaccine given: n/a Tdap vaccine given in AP or PP setting: Allergic Flu vaccine given in AP or PP setting: 10/15/19  Plan: Michele Hicks was discharged to home in good condition. Follow-up appointment with delivering provider in 6 weeks.  Discharge Instructions: Per After Visit Summary. Activity: Advance as tolerated. Pelvic rest for 6 weeks.   Diet: Regular Discharge Medications: Allergies as of 04/07/2020      Reactions   Codeine Other (See Comments)   Chest pain and chest tightness   Tdap [tetanus-diphth-acell Pertussis] Other (See Comments)   Seizures   Penicillins Rash, Other (See Comments)   Has patient had a PCN reaction causing immediate rash, facial/tongue/throat swelling, SOB or lightheadedness with hypotension: Yes Has patient had a PCN reaction causing severe rash involving mucus membranes or skin necrosis: No Has patient had a PCN reaction that required hospitalization: No Has patient had a PCN reaction occurring within the last 10 years: Yes  If all of the above answers are "NO", then may proceed with Cephalosporin use.      Medication List    TAKE these medications   acetaminophen 500 MG tablet Commonly known as: TYLENOL Take 2 tablets (1,000 mg total) by mouth every 6 (six) hours as needed for mild pain or moderate pain.   cyclobenzaprine 5 MG tablet Commonly known as: FLEXERIL Take 1 tablet (5 mg total) by mouth 3 (three) times daily as needed for muscle spasms.   fluticasone 50 MCG/ACT nasal spray Commonly known as: FLONASE Place 1-2 sprays into both nostrils daily.   HYDROmorphone 2 MG tablet Commonly known as: DILAUDID Take 1 tablet (2 mg total) by mouth every 6 (six) hours as needed for up to 3 days for severe pain.   ibuprofen 600 MG tablet Commonly known as: ADVIL Take 1 tablet (600 mg total) by mouth every 6 (six) hours as needed for mild pain or moderate pain.   multivitamin-prenatal 27-0.8 MG  Tabs tablet Take 1 tablet by mouth daily.   pantoprazole 40 MG tablet Commonly known as: PROTONIX Take 40 mg by mouth daily.      Outpatient follow up:  Follow-up Information    Benjaman Kindler, MD Follow up in 2 week(s).   Specialty: Obstetrics and Gynecology Why: For postop check Contact information: [place] [address] [phone]           Signed: Regina Eck 04/07/2020 12:28 PM

## 2020-04-07 NOTE — Lactation Note (Signed)
This note was copied from a baby's chart. Lactation Consultation Note  Patient Name: Miya BG Larsen Today's Date: 04/07/2020 Reason for consult: Follow-up assessment;Term;Other (Comment)(c/s delivery)  LC in to see mom and baby before discharge. Mom reports just finishing feeding, 20 minutes on left breast. Mom feels baby is doing well, no pain/discomfort reported when asked.  Mom breastfed with older child, 18 months, after successful tongue-tie release. MD has stated after review he doesn't feel that this baby has a tie; LC provided support for decision, but encouraged to seek help if she starts to feel pain/discomfort, or has nipple damage following feeds.  Reviewed BF basics for days/weeks to come: output expectations, signs of milk transfer, feeding with early cues, growth spurts/cluster feeding, and breast/nipple health and care and engorgement and breast fullness management.   Information given for outpatient lactation services and community breastfeeding support resources.  Maternal Data Formula Feeding for Exclusion: No Has patient been taught Hand Expression?: Yes Does the patient have breastfeeding experience prior to this delivery?: Yes  Feeding Feeding Type: Breast Fed  LATCH Score                   Interventions Interventions: Breast feeding basics reviewed;DEBP  Lactation Tools Discussed/Used     Consult Status Consult Status: Complete Date: 04/07/20 Follow-up type: Call as needed    Danford Bad 04/07/2020, 9:34 AM

## 2020-04-07 NOTE — Progress Notes (Signed)
DC to home;  To car via auxillary 

## 2020-08-22 IMAGING — MG DIGITAL DIAGNOSTIC BILATERAL MAMMOGRAM WITH TOMO AND CAD
8 of 14 series · 8 of 40 positions shown · non-contrast
Comparison: None.

CLINICAL DATA: 37-year-old female with a palpable right breast lump
for approximately 1 month. The patient is currently breast feeding
her 14-month-old at nighttime only.

EXAM:
DIGITAL DIAGNOSTIC BILATERAL MAMMOGRAM WITH CAD AND TOMO
ULTRASOUND RIGHT BREAST

[R TAN synth-2D]
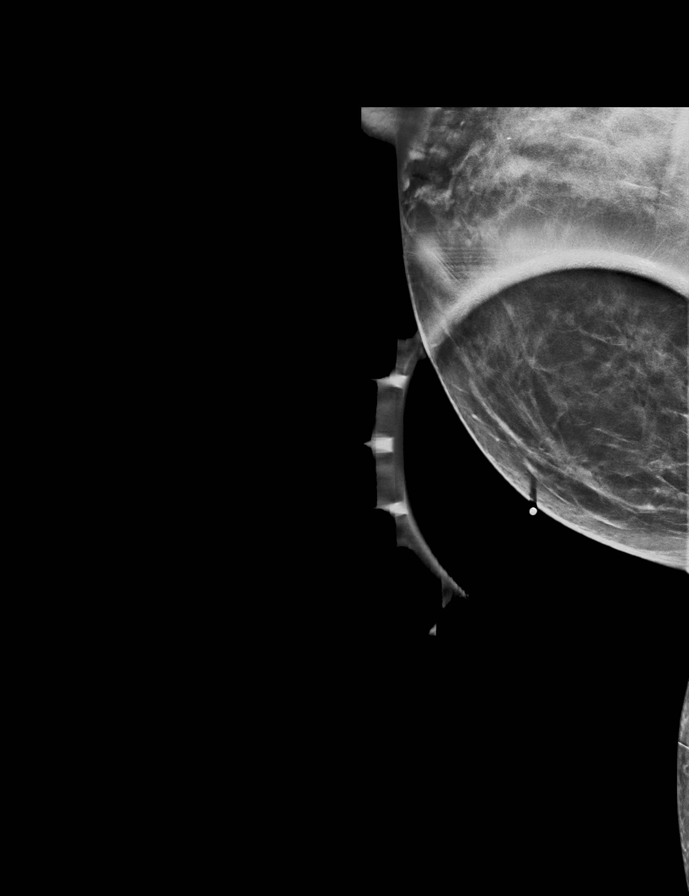

[L MLO synth-2D]
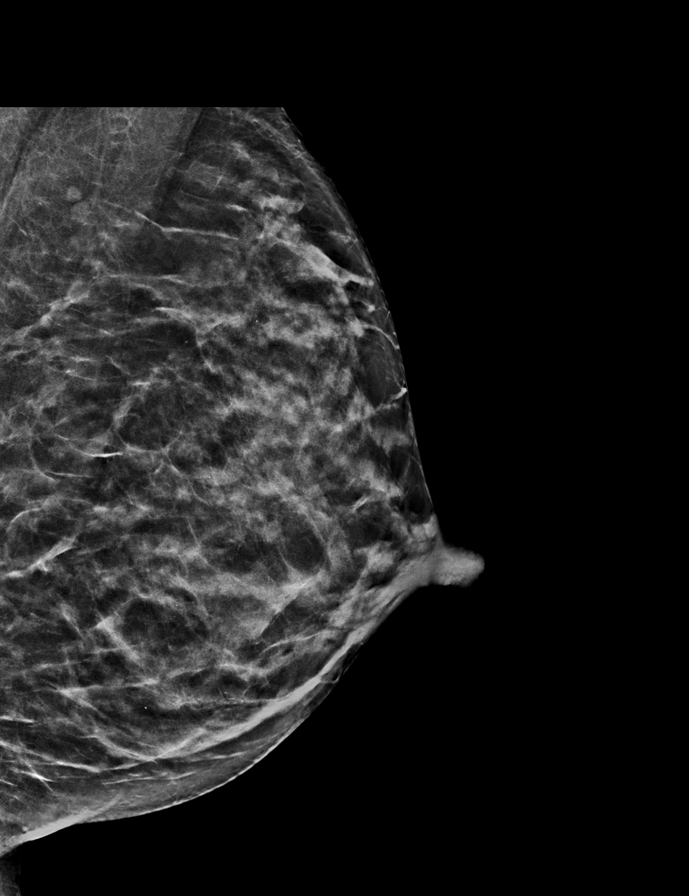

[R MLO synth-2D (1 of 2)]
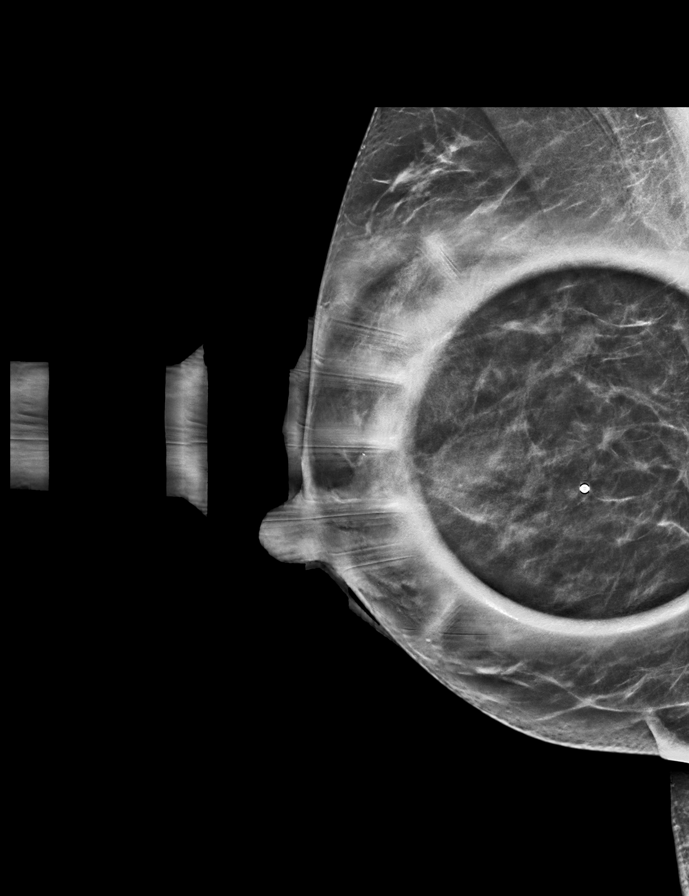

[L CC synth-2D]
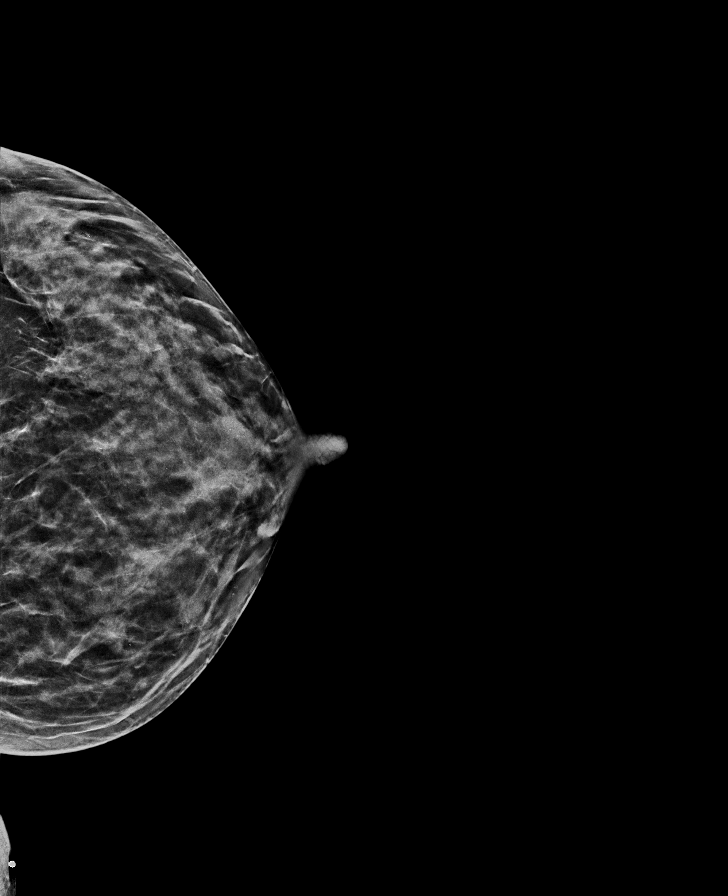

[R CC synth-2D (1 of 2)]
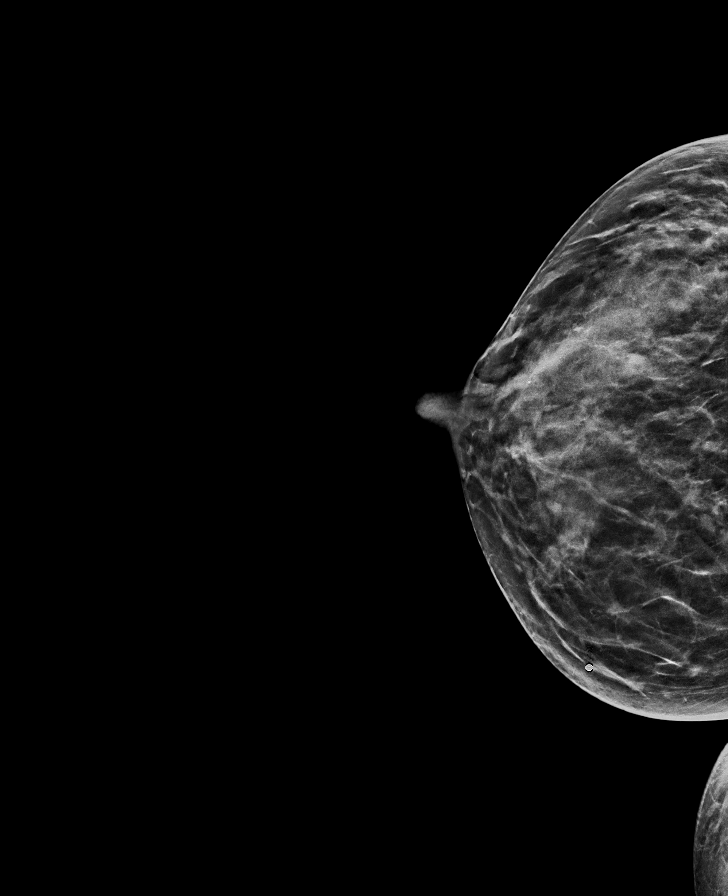

[R CC synth-2D (2 of 2)]
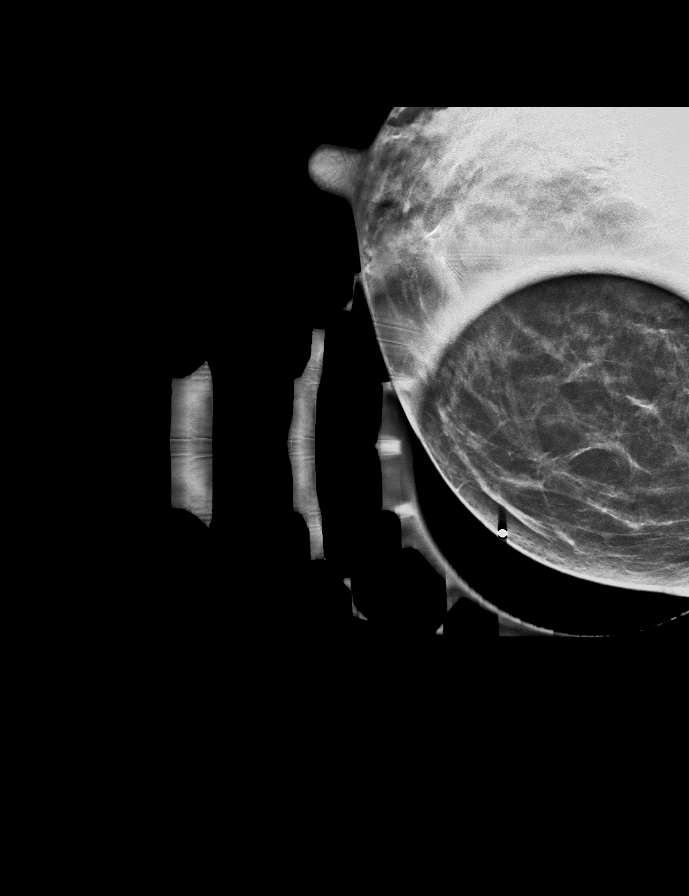

[R MLO synth-2D (2 of 2)]
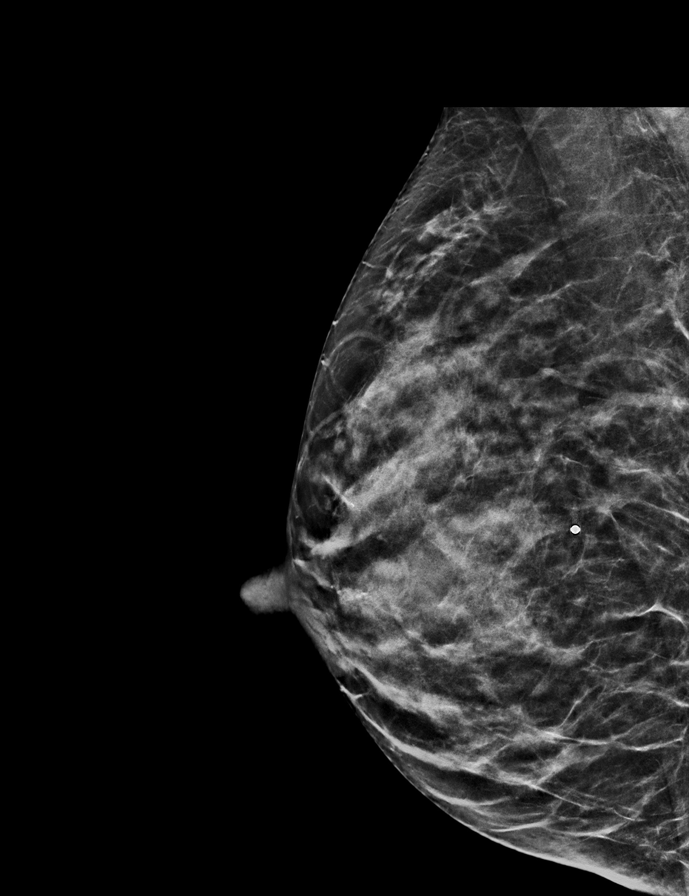

[R MLO tomo · tomo slice 29/58.0]
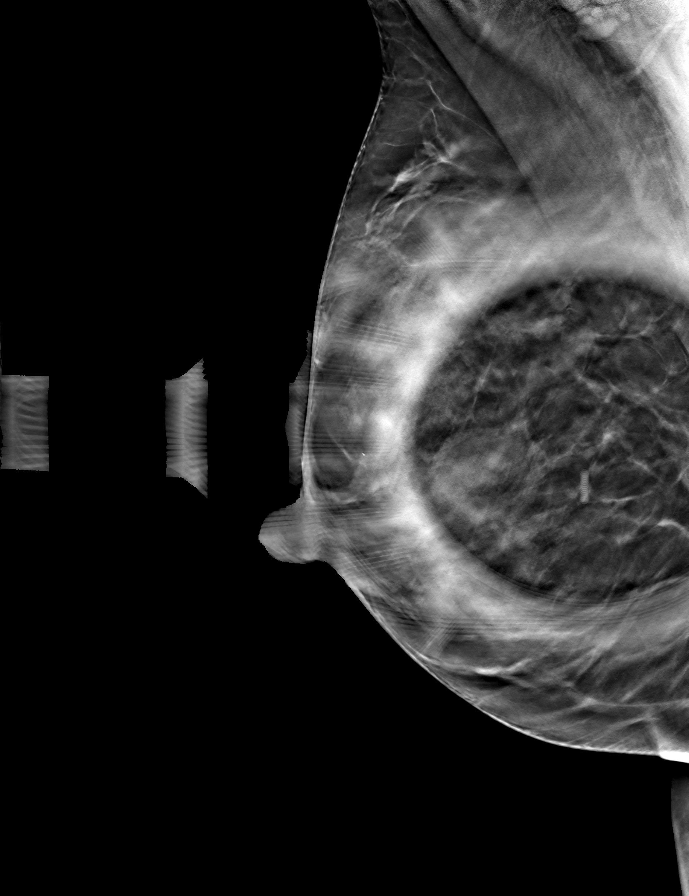

[8 of 40 positions shown; findings below may reference images not displayed]

ACR Breast Density Category d: The breast tissue is extremely dense,
which lowers the sensitivity of mammography.
FINDINGS: A radiopaque BB was placed at the site of the patient's palpable
lump in the upper inner right breast at posterior depth. An area of
focal distortion is seen deep to the radiopaque BB. No additional
suspicious mammographic findings are identified in either breast.

Mammographic images were processed with CAD.

On physical exam, I am able to palpate a fixed area of thickening
along the upper inner right breast.

Targeted ultrasound is performed, showing an spiculated hypoechoic
mass with associated distortion at the 2 o'clock position 5 cm from
the nipple. It measures 1.7 x 0.6 x 0.4 cm. There is associated
vascularity. An additional similar appearing masses identified at
the [DATE] position 4 cm from the nipple. It measures 5 x 4 x 3 mm.

Evaluation of the right axilla demonstrates multiple prominent lymph
nodes with borderline, diffuse cortical thickening up to 3 mm. One
of these lymph nodes demonstrates a lobulated contour or with areas
of more focal cortical thickening.
IMPRESSION: 1. Suspicious right breast mass corresponding with the patient's
palpable abnormality at the 2 o'clock position. Recommendation is
for ultrasound-guided biopsy.
2. Indeterminate satellite lesion at the [DATE] position.
Recommendation is for ultrasound-guided biopsy.
3. Indeterminate right axillary lymphadenopathy. There are multiple
lymph nodes with borderline diffuse cortical thickening and 1 lymph
node which demonstrates a lobulated contour with areas of more focal
cortical thickening. Recommendation is for ultrasound-guided biopsy
of the lobulated lymph node.
4. No definite mammographic evidence of malignancy on the left.

RECOMMENDATION:
1. Three area ultrasound-guided biopsy of the right side including
the index mass, satellite mass and a single axillary lymph node.
2. Pending biopsy results, contrast enhanced breast MRI is
recommended for further evaluation of extent of disease given
multifocality of today's findings and the patient's extreme breast
density.

I have discussed the findings and recommendations with the patient.
Results were also provided in writing at the conclusion of the
visit. If applicable, a reminder letter will be sent to the patient
regarding the next appointment.

BI-RADS CATEGORY  5: Highly suggestive of malignancy.

## 2022-09-13 ENCOUNTER — Ambulatory Visit: Admit: 2022-09-13 | Payer: BC Managed Care – PPO

## 2023-01-31 ENCOUNTER — Emergency Department
Admission: EM | Admit: 2023-01-31 | Discharge: 2023-01-31 | Disposition: A | Discharge status: Home / Self Care | Payer: BC Managed Care – PPO | Attending: Emergency Medicine | Admitting: Emergency Medicine

## 2023-01-31 ENCOUNTER — Encounter: Payer: Self-pay | Admitting: *Deleted

## 2023-01-31 ENCOUNTER — Other Ambulatory Visit: Payer: Self-pay

## 2023-01-31 DIAGNOSIS — L299 Pruritus, unspecified: Secondary | ICD-10-CM | POA: Diagnosis present

## 2023-01-31 DIAGNOSIS — T364X5A Adverse effect of tetracyclines, initial encounter: Secondary | ICD-10-CM | POA: Diagnosis not present

## 2023-01-31 DIAGNOSIS — T7840XA Allergy, unspecified, initial encounter: Secondary | ICD-10-CM

## 2023-01-31 MED ORDER — DIPHENHYDRAMINE HCL 25 MG PO CAPS
50.0000 mg | ORAL_CAPSULE | Freq: Once | ORAL | Status: AC
  Administered 2023-01-31: 50 mg via ORAL
  Filled 2023-01-31: qty 2

## 2023-01-31 MED ORDER — FAMOTIDINE 20 MG PO TABS
20.0000 mg | ORAL_TABLET | Freq: Once | ORAL | Status: AC
  Administered 2023-01-31: 20 mg via ORAL
  Filled 2023-01-31: qty 1

## 2023-01-31 NOTE — Discharge Instructions (Addendum)
Please continue using Benadryl 25 mg by mouth every 8 hours for the next 72 hours.  Please take 20 mg of Pepcid every day for the next 3 days.

## 2023-01-31 NOTE — ED Triage Notes (Addendum)
Pt took a doxycycline for a sinus infection today and soon afterwards started having pain in neck   Pt vomited doxy.  No resp distress  pt reports pain in right side of neck  pt crying in triage.  Hx panic attacks.  Pt alert  speech clear.  Pt reports tight feeling in right neck area.

## 2023-01-31 NOTE — ED Provider Notes (Signed)
[facility] Provider Note   Event Date/Time   First MD Initiated Contact with Patient 01/31/23 1919     (approximate) History  Allergic Reaction  HPI Michele Hicks is a 42 y.o. female with no stated past medical history presents after an alleged allergic reaction to doxycycline.  Patient states that 30 minutes after starting to take new antibiotic (doxycycline) she began having flushing, and generalized redness over the neck/trunk/upper extremities.  Patient also endorses itching and nausea.  Patient denies any shortness of breath, difficulty swallowing, or difficulty breathing ROS: Patient currently denies any vision changes, tinnitus, difficulty speaking, facial droop, sore throat, chest pain, shortness of breath, abdominal pain, nausea/vomiting/diarrhea, dysuria, or weakness/numbness/paresthesias in any extremity   Physical Exam  Triage Vital Signs: ED Triage Vitals  Enc Vitals Group     BP 01/31/23 1901 (!) 151/104     Pulse Rate 01/31/23 1901 100     Resp 01/31/23 1901 18     Temp 01/31/23 1901 97.9 F (36.6 C)     Temp Source 01/31/23 1901 Oral     SpO2 01/31/23 1901 100 %     Weight 01/31/23 1859 127 lb (57.6 kg)     Height 01/31/23 1859 '5\' 3"'$  (1.6 m)     Head Circumference --      Peak Flow --      Pain Score 01/31/23 1859 3     Pain Loc --      Pain Edu? --      Excl. in [place]? --    Most recent vital signs: Vitals:   01/31/23 2115 01/31/23 2134  BP: (!) 115/58   Pulse: 72   Resp: 18   Temp: 97.8 F (36.6 C) 97.8 F (36.6 C)  SpO2: 100%    General: Awake, oriented x4. CV:  Good peripheral perfusion.  Resp:  Normal effort.  Abd:  No distention.  Other:  Middle-aged Caucasian female sitting in stretcher in no acute distress with erythema over the neck, upper chest, and upper shoulders without any wheals or concerning for urticaria ED Results / Procedures / Treatments   PROCEDURES: Critical Care performed: No Procedures MEDICATIONS  ORDERED IN ED: Medications  diphenhydrAMINE (BENADRYL) capsule 50 mg (50 mg Oral Given 01/31/23 1952)  famotidine (PEPCID) tablet 20 mg (20 mg Oral Given 01/31/23 1952)   IMPRESSION / MDM / ASSESSMENT AND PLAN / ED COURSE  I reviewed the triage vital signs and the nursing notes.                             The patient is on the cardiac monitor to evaluate for evidence of arrhythmia and/or significant heart rate changes. Patient's presentation is most consistent with acute presentation with potential threat to life or bodily function. 42 year old female presents for allergic reaction after doxycycline ingestion + Cutaneous erythema No evidence of multiorgan involvement Patient received 50 mg of Benadryl p.o. and 20 mg Pepcid p.o. with improvement in patient's symptoms and 45 minutes Given history and exam, presentation most consistent with allergic reaction. I have low suspicion for toxic shock syndrome, anaphylaxis, asthma exacerbation, or drug toxicity. Rx: Benadryl '25mg'$  q8hr x3days Disposition: Discharge home with SRP. Follow up with PCP in 1-2 days.   FINAL CLINICAL IMPRESSION(S) / ED DIAGNOSES   Final diagnoses:  Allergic reaction to drug, initial encounter   Rx / DC Orders   ED Discharge Orders     None  Note:  This document was prepared using Dragon voice recognition software and may include unintentional dictation errors.   Naaman Plummer, MD 01/31/23 [phone]

## 2023-08-11 ENCOUNTER — Emergency Department: Payer: BC Managed Care – PPO

## 2023-08-11 ENCOUNTER — Emergency Department
Admission: EM | Admit: 2023-08-11 | Discharge: 2023-08-11 | Disposition: A | Discharge status: Home / Self Care | Payer: BC Managed Care – PPO | Attending: Emergency Medicine | Admitting: Emergency Medicine

## 2023-08-11 ENCOUNTER — Other Ambulatory Visit: Payer: Self-pay

## 2023-08-11 DIAGNOSIS — R0789 Other chest pain: Secondary | ICD-10-CM | POA: Diagnosis present

## 2023-08-11 DIAGNOSIS — M940 Chondrocostal junction syndrome [Tietze]: Secondary | ICD-10-CM | POA: Insufficient documentation

## 2023-08-11 LAB — URINALYSIS, ROUTINE W REFLEX MICROSCOPIC
Bilirubin Urine: NEGATIVE
Glucose, UA: NEGATIVE mg/dL
Hgb urine dipstick: NEGATIVE
Ketones, ur: NEGATIVE mg/dL
Leukocytes,Ua: NEGATIVE
Nitrite: NEGATIVE
Protein, ur: NEGATIVE mg/dL
Specific Gravity, Urine: 1.02 (ref 1.005–1.030)
pH: 6 (ref 5.0–8.0)

## 2023-08-11 LAB — COMPREHENSIVE METABOLIC PANEL
ALT: 15 U/L (ref 0–44)
AST: 18 U/L (ref 15–41)
Albumin: 4.3 g/dL (ref 3.5–5.0)
Alkaline Phosphatase: 42 U/L (ref 38–126)
Anion gap: 12 (ref 5–15)
BUN: 12 mg/dL (ref 6–20)
CO2: 23 mmol/L (ref 22–32)
Calcium: 8.8 mg/dL — ABNORMAL LOW (ref 8.9–10.3)
Chloride: 99 mmol/L (ref 98–111)
Creatinine, Ser: 0.73 mg/dL (ref 0.44–1.00)
GFR, Estimated: >60 mL/min (ref >60)
Glucose, Bld: 107 mg/dL — ABNORMAL HIGH (ref 70–99)
Potassium: 3.2 mmol/L — ABNORMAL LOW (ref 3.5–5.1)
Sodium: 134 mmol/L — ABNORMAL LOW (ref 135–145)
Total Bilirubin: 0.7 mg/dL (ref 0.3–1.2)
Total Protein: 7.6 g/dL (ref 6.5–8.1)

## 2023-08-11 LAB — CBC
HCT: 39.4 % (ref 36.0–46.0)
Hemoglobin: 13.4 g/dL (ref 12.0–15.0)
MCH: 32.4 pg (ref 26.0–34.0)
MCHC: 34 g/dL (ref 30.0–36.0)
MCV: 95.2 fL (ref 80.0–100.0)
Platelets: 293 10*3/uL (ref 150–400)
RBC: 4.14 MIL/uL (ref 3.87–5.11)
RDW: 11.9 % (ref 11.5–15.5)
WBC: 8 10*3/uL (ref 4.0–10.5)
nRBC: 0 % (ref 0.0–0.2)

## 2023-08-11 LAB — TROPONIN I (HIGH SENSITIVITY): Troponin I (High Sensitivity): <2 ng/L (ref <18)

## 2023-08-11 LAB — LIPASE, BLOOD: Lipase: 27 U/L (ref 11–51)

## 2023-08-11 MED ORDER — CYCLOBENZAPRINE HCL 10 MG PO TABS
5.0000 mg | ORAL_TABLET | Freq: Once | ORAL | Status: DC
  Filled 2023-08-11: qty 1

## 2023-08-11 MED ORDER — IBUPROFEN 600 MG PO TABS
600.0000 mg | ORAL_TABLET | Freq: Once | ORAL | Status: DC
  Filled 2023-08-11: qty 1

## 2023-08-11 NOTE — ED Provider Notes (Signed)
[facility] Provider Note    Event Date/Time   First MD Initiated Contact with Patient 08/11/23 1648     (approximate)   History   Abdominal Pain (Sudden onset)   HPI  Michele Hicks is a 42 y.o. female   with past medical history of anxiety here with right upper quadrant/lower chest pain.  The patient states that over the last day, she has had intermittent sharp, stabbing, right lower chest wall/upper quadrant abdominal pain.  It has come and gone completely.  She was just standing at the time.  Denies any nausea or vomiting.  Denies any preceding symptoms.  No recent falls.  She does have small children and has been doing a lot of laundry, denies any other repetitive use of her extremities.  No other injuries.  No other complaints.  No fevers or chills.      Physical Exam   Triage Vital Signs: ED Triage Vitals  Encounter Vitals Group     BP 08/11/23 1643 136/86     Systolic BP Percentile --      Diastolic BP Percentile --      Pulse Rate 08/11/23 1643 88     Resp 08/11/23 1643 16     Temp 08/11/23 1643 98.2 F (36.8 C)     Temp Source 08/11/23 1643 Oral     SpO2 08/11/23 1643 98 %     Weight 08/11/23 1644 128 lb (58.1 kg)     Height 08/11/23 1644 5\' 3"  (1.6 m)     Head Circumference --      Peak Flow --      Pain Score 08/11/23 1643 6     Pain Loc --      Pain Education --      Exclude from Growth Chart --     Most recent vital signs: Vitals:   08/11/23 1800 08/11/23 1830  BP: 111/67 108/66  Pulse: 74 80  Resp:    Temp:    SpO2: 100% 100%     General: Awake, no distress.  CV:  Good peripheral perfusion.  Regular rate and rhythm.  No murmurs. Resp:  Normal work of breathing.  Lungs clear to auscultation bilaterally. Abd:  No distention.  No major tenderness.  She points to right upper quadrant as area of pain, but there is no reproducible tenderness on examination.  Negative Murphy's.  No tenderness over the subcostal space or  costal margin. Other:  Anxious.   ED Results / Procedures / Treatments   Labs (all labs ordered are listed, but only abnormal results are displayed) Labs Reviewed  COMPREHENSIVE METABOLIC PANEL - Abnormal; Notable for the following components:      Result Value   Sodium 134 (*)    Potassium 3.2 (*)    Glucose, Bld 107 (*)    Calcium 8.8 (*)    All other components within normal limits  URINALYSIS, ROUTINE W REFLEX MICROSCOPIC - Abnormal; Notable for the following components:   Color, Urine YELLOW (*)    APPearance CLEAR (*)    All other components within normal limits  LIPASE, BLOOD  CBC  POC URINE PREG, ED  TROPONIN I (HIGH SENSITIVITY)     EKG Normal sinus rhythm, ventricular at 77.  PR 153, QRS 98, QTc 425.  No acute ST elevations or depressions.  EKG showed acute ischemia or infarct.   RADIOLOGY [place] RUQ: No acute abnormality CXR: Clear   I also independently reviewed and agree  with radiologist interpretations.   PROCEDURES:  Critical Care performed: No   MEDICATIONS ORDERED IN ED: Medications  ibuprofen (ADVIL) tablet 600 mg (0 mg Oral Hold 08/11/23 1753)  cyclobenzaprine (FLEXERIL) tablet 5 mg (0 mg Oral Hold 08/11/23 1753)     IMPRESSION / MDM / ASSESSMENT AND PLAN / ED COURSE  I reviewed the triage vital signs and the nursing notes.                              Differential diagnosis includes, but is not limited to, musculoskeletal chest wall pain, cholecystitis, biliary colic, IBS, chest wall pain, shingles  Patient's presentation is most consistent with acute presentation with potential threat to life or bodily function.  The patient is on the cardiac monitor to evaluate for evidence of arrhythmia and/or significant heart rate changes   42 year old female here with transient, sharp, right lower chest wall pain.  Suspect costochondritis or intercostal pain.  Pain was initially reproducible on exam although she is essentially pain-free now.  She has no  major abdominal tenderness.  Right upper quadrant ultrasound is not good.  EKG is nonischemic.  Troponin negative.  CBC and CMP are unremarkable.  Urinalysis shows no signs of UTI or stone.  Will treat with NSAIDs at home and return precautions.  No apparent emergent pathology.   FINAL CLINICAL IMPRESSION(S) / ED DIAGNOSES   Final diagnoses:  Chest wall pain  Costochondritis     Rx / DC Orders   ED Discharge Orders     None        Note:  This document was prepared using Dragon voice recognition software and may include unintentional dictation errors.   Shaune Pollack, MD 08/11/23 [phone]

## 2023-08-11 NOTE — Discharge Instructions (Signed)
For your pain:  Take Ibuprofen 600 mg every 8 hours OR Alleve twice daily for the next 3-5 days, then as needed Take Tylenol 1000 mg every 6 hours for breakthrough pain You can apply lidocaine patches/gel as needed as well Minimize heavy lifting/twisting/overuse of the R arm/chest

## 2023-08-11 NOTE — ED Triage Notes (Signed)
Pt to ed from home via POV for sudden onset of abd pain. Pt denies any urinary symptoms at this time. Pt is caox4, in no acute distress and ambulatory in triage.

## 2023-08-13 LAB — POC URINE PREG, ED: Preg Test, Ur: NEGATIVE

## 2023-08-13 NOTE — Group Note (Deleted)
